# Patient Record
Sex: Female | Born: 1985 | Race: Black or African American | Hispanic: No | Marital: Single | Smoking: Never smoker
Health system: Southern US, Community
[De-identification: ages and names within clinical notes are randomized; demographics above are authoritative.]

## PROBLEM LIST (undated history)

## (undated) DIAGNOSIS — O24419 Gestational diabetes mellitus in pregnancy, unspecified control: Secondary | ICD-10-CM

## (undated) DIAGNOSIS — R519 Headache, unspecified: Secondary | ICD-10-CM

## (undated) DIAGNOSIS — Z8759 Personal history of other complications of pregnancy, childbirth and the puerperium: Secondary | ICD-10-CM

## (undated) DIAGNOSIS — F419 Anxiety disorder, unspecified: Secondary | ICD-10-CM

## (undated) DIAGNOSIS — R51 Headache: Secondary | ICD-10-CM

## (undated) DIAGNOSIS — F10129 Alcohol abuse with intoxication, unspecified: Secondary | ICD-10-CM

## (undated) HISTORY — DX: Personal history of other complications of pregnancy, childbirth and the puerperium: Z87.59

## (undated) HISTORY — DX: Alcohol abuse with intoxication, unspecified: F10.129

---
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
## 2005-01-25 HISTORY — PX: FOOT SURGERY: SHX648

## 2005-07-14 ENCOUNTER — Other Ambulatory Visit: Admission: RE | Admit: 2005-07-14 | Discharge: 2005-07-14 | Payer: Self-pay | Admitting: Family Medicine

## 2007-05-27 ENCOUNTER — Emergency Department (HOSPITAL_COMMUNITY): Admission: EM | Admit: 2007-05-27 | Discharge: 2007-05-27 | Payer: Self-pay | Admitting: Emergency Medicine

## 2007-06-22 ENCOUNTER — Other Ambulatory Visit: Admission: RE | Admit: 2007-06-22 | Discharge: 2007-06-22 | Payer: Self-pay | Admitting: Obstetrics and Gynecology

## 2007-06-29 ENCOUNTER — Emergency Department (HOSPITAL_COMMUNITY): Admission: EM | Admit: 2007-06-29 | Discharge: 2007-06-29 | Payer: Self-pay | Admitting: Emergency Medicine

## 2007-10-27 ENCOUNTER — Inpatient Hospital Stay (HOSPITAL_COMMUNITY): Admission: AD | Admit: 2007-10-27 | Discharge: 2007-10-27 | Payer: Self-pay | Admitting: Obstetrics and Gynecology

## 2007-11-28 ENCOUNTER — Inpatient Hospital Stay (HOSPITAL_COMMUNITY): Admission: AD | Admit: 2007-11-28 | Discharge: 2007-11-28 | Payer: Self-pay | Admitting: Obstetrics and Gynecology

## 2007-12-18 ENCOUNTER — Inpatient Hospital Stay (HOSPITAL_COMMUNITY): Admission: AD | Admit: 2007-12-18 | Discharge: 2007-12-18 | Payer: Self-pay | Admitting: Obstetrics and Gynecology

## 2008-01-01 ENCOUNTER — Inpatient Hospital Stay (HOSPITAL_COMMUNITY): Admission: AD | Admit: 2008-01-01 | Discharge: 2008-01-03 | Payer: Self-pay | Admitting: Obstetrics and Gynecology

## 2008-09-27 ENCOUNTER — Emergency Department (HOSPITAL_COMMUNITY): Admission: EM | Admit: 2008-09-27 | Discharge: 2008-09-27 | Payer: Self-pay | Admitting: Emergency Medicine

## 2010-01-13 ENCOUNTER — Other Ambulatory Visit
Admission: RE | Admit: 2010-01-13 | Discharge: 2010-01-13 | Payer: Self-pay | Source: Home / Self Care | Admitting: Family Medicine

## 2010-01-25 NOTE — L&D Delivery Note (Signed)
Operative Delivery Note At 6:25 PM a viable female was delivered via Vaginal, Spontaneous Delivery.  Presentation: vertex; Position: Direct Anterior ;   Delivery of the head: 09/30/2010  6:24 PM First maneuver: 09/30/2010  6:24 PM, Suprapubic Pressure McRoberts Second maneuver: Attempted delivery of posterior shoulder but difficult to mobilize. Attempted to delivery anterior shoulder with suprapubic pressure and shoulder delivered slowly. 1 min, 25 sec on perineum per RN.  Verbal consent: unable to obtain verbal consent due to emergency.  APGAR: 8, 9; weight 9 lb 5.4 oz (4235 g).   Placenta status: Intact, Spontaneous.   Cord: 3 vessels with the following complications: None.  Cord pH: 7.31  Nuchal cord x 1 easily reduced.  Anesthesia: Epidural  Episiotomy: None Lacerations: 2nd degree;Perineal Suture Repair: 2.0 3.0 chromic Est. Blood Loss (mL): 300 3 cc of 1% Lidocaine used  Mom to postpartum.  Baby to nursery-stable.  Sue Cooper 09/30/2010, 7:10 PM

## 2010-03-05 ENCOUNTER — Emergency Department (HOSPITAL_COMMUNITY)
Admission: EM | Admit: 2010-03-05 | Discharge: 2010-03-05 | Disposition: A | Discharge status: Home / Self Care | Payer: 59 | Attending: Emergency Medicine | Admitting: Emergency Medicine

## 2010-03-05 DIAGNOSIS — O210 Mild hyperemesis gravidarum: Secondary | ICD-10-CM | POA: Insufficient documentation

## 2010-03-05 DIAGNOSIS — O99891 Other specified diseases and conditions complicating pregnancy: Secondary | ICD-10-CM | POA: Insufficient documentation

## 2010-03-05 DIAGNOSIS — J3489 Other specified disorders of nose and nasal sinuses: Secondary | ICD-10-CM | POA: Insufficient documentation

## 2010-03-05 DIAGNOSIS — R42 Dizziness and giddiness: Secondary | ICD-10-CM | POA: Insufficient documentation

## 2010-03-05 LAB — POCT I-STAT, CHEM 8
BUN: 9 mg/dL (ref 6–23)
Calcium, Ion: 1.2 mmol/L (ref 1.12–1.32)
Chloride: 100 mEq/L (ref 96–112)
Creatinine, Ser: 0.8 mg/dL (ref 0.4–1.2)
Glucose, Bld: 121 mg/dL — ABNORMAL HIGH (ref 70–99)
HCT: 42 % (ref 36.0–46.0)
Hemoglobin: 14.3 g/dL (ref 12.0–15.0)
Potassium: 4 mEq/L (ref 3.5–5.1)
Sodium: 137 mEq/L (ref 135–145)
TCO2: 27 mmol/L (ref 0–100)

## 2010-03-05 LAB — URINALYSIS, ROUTINE W REFLEX MICROSCOPIC
Hgb urine dipstick: NEGATIVE
Ketones, ur: NEGATIVE mg/dL
Protein, ur: NEGATIVE mg/dL
Urine Glucose, Fasting: NEGATIVE mg/dL
pH: 6.5 (ref 5.0–8.0)

## 2010-03-05 LAB — POCT PREGNANCY, URINE: Preg Test, Ur: POSITIVE

## 2010-03-16 LAB — ABO/RH: RH Type: POSITIVE

## 2010-03-16 LAB — TYPE AND SCREEN: Antibody Screen: NEGATIVE

## 2010-03-16 LAB — HIV ANTIBODY (ROUTINE TESTING W REFLEX): HIV: NONREACTIVE

## 2010-03-16 LAB — HEPATITIS B SURFACE ANTIGEN: Hepatitis B Surface Ag: NEGATIVE

## 2010-04-09 ENCOUNTER — Inpatient Hospital Stay (HOSPITAL_COMMUNITY)
Admission: AD | Admit: 2010-04-09 | Discharge: 2010-04-09 | Disposition: A | Discharge status: Home / Self Care | Payer: 59 | Source: Ambulatory Visit | Attending: Obstetrics and Gynecology | Admitting: Obstetrics and Gynecology

## 2010-04-09 ENCOUNTER — Inpatient Hospital Stay (HOSPITAL_COMMUNITY): Payer: 59

## 2010-04-09 DIAGNOSIS — R1032 Left lower quadrant pain: Secondary | ICD-10-CM | POA: Insufficient documentation

## 2010-04-09 DIAGNOSIS — O99891 Other specified diseases and conditions complicating pregnancy: Secondary | ICD-10-CM | POA: Insufficient documentation

## 2010-04-09 DIAGNOSIS — O9989 Other specified diseases and conditions complicating pregnancy, childbirth and the puerperium: Secondary | ICD-10-CM

## 2010-04-09 LAB — WET PREP, GENITAL
Clue Cells Wet Prep HPF POC: NONE SEEN
Trich, Wet Prep: NONE SEEN

## 2010-04-09 LAB — URINALYSIS, ROUTINE W REFLEX MICROSCOPIC
Glucose, UA: NEGATIVE mg/dL
Ketones, ur: NEGATIVE mg/dL
Nitrite: NEGATIVE
Specific Gravity, Urine: 1.025 (ref 1.005–1.030)
pH: 7 (ref 5.0–8.0)

## 2010-07-13 ENCOUNTER — Inpatient Hospital Stay (HOSPITAL_COMMUNITY)
Admission: AD | Admit: 2010-07-13 | Discharge: 2010-07-13 | Disposition: A | Discharge status: Home / Self Care | Payer: 59 | Source: Ambulatory Visit | Attending: Obstetrics and Gynecology | Admitting: Obstetrics and Gynecology

## 2010-07-13 DIAGNOSIS — O9989 Other specified diseases and conditions complicating pregnancy, childbirth and the puerperium: Secondary | ICD-10-CM

## 2010-07-13 DIAGNOSIS — R109 Unspecified abdominal pain: Secondary | ICD-10-CM

## 2010-07-13 DIAGNOSIS — O99891 Other specified diseases and conditions complicating pregnancy: Secondary | ICD-10-CM

## 2010-07-13 LAB — URINALYSIS, ROUTINE W REFLEX MICROSCOPIC
Bilirubin Urine: NEGATIVE
Hgb urine dipstick: NEGATIVE
Ketones, ur: 15 mg/dL — AB
Nitrite: NEGATIVE
Protein, ur: NEGATIVE mg/dL
Specific Gravity, Urine: 1.015 (ref 1.005–1.030)
Urobilinogen, UA: 0.2 mg/dL (ref 0.0–1.0)

## 2010-07-13 LAB — FETAL FIBRONECTIN: Fetal Fibronectin: NEGATIVE

## 2010-07-15 ENCOUNTER — Inpatient Hospital Stay (HOSPITAL_COMMUNITY)
Admission: AD | Admit: 2010-07-15 | Discharge: 2010-07-15 | Disposition: A | Discharge status: Home / Self Care | Payer: 59 | Source: Ambulatory Visit | Attending: Obstetrics and Gynecology | Admitting: Obstetrics and Gynecology

## 2010-07-15 DIAGNOSIS — Z79899 Other long term (current) drug therapy: Secondary | ICD-10-CM | POA: Insufficient documentation

## 2010-07-15 DIAGNOSIS — N764 Abscess of vulva: Secondary | ICD-10-CM | POA: Insufficient documentation

## 2010-07-15 DIAGNOSIS — O2390 Unspecified genitourinary tract infection in pregnancy, unspecified trimester: Secondary | ICD-10-CM

## 2010-08-17 ENCOUNTER — Encounter (HOSPITAL_COMMUNITY): Payer: Self-pay | Admitting: *Deleted

## 2010-08-17 ENCOUNTER — Inpatient Hospital Stay (HOSPITAL_COMMUNITY)
Admission: AD | Admit: 2010-08-17 | Discharge: 2010-08-19 | DRG: 781 | Disposition: A | Discharge status: Home / Self Care | Payer: 59 | Source: Ambulatory Visit | Attending: Obstetrics and Gynecology | Admitting: Obstetrics and Gynecology

## 2010-08-17 DIAGNOSIS — O99810 Abnormal glucose complicating pregnancy: Principal | ICD-10-CM | POA: Diagnosis present

## 2010-08-17 DIAGNOSIS — O24419 Gestational diabetes mellitus in pregnancy, unspecified control: Secondary | ICD-10-CM

## 2010-08-17 DIAGNOSIS — O368390 Maternal care for abnormalities of the fetal heart rate or rhythm, unspecified trimester, not applicable or unspecified: Secondary | ICD-10-CM | POA: Diagnosis present

## 2010-08-17 DIAGNOSIS — O4700 False labor before 37 completed weeks of gestation, unspecified trimester: Secondary | ICD-10-CM | POA: Diagnosis present

## 2010-08-17 LAB — URINALYSIS, ROUTINE W REFLEX MICROSCOPIC
Bilirubin Urine: NEGATIVE
Ketones, ur: >80 mg/dL — AB
Leukocytes, UA: NEGATIVE
Nitrite: NEGATIVE
Protein, ur: NEGATIVE mg/dL

## 2010-08-17 LAB — GLUCOSE, CAPILLARY

## 2010-08-17 LAB — CBC
MCH: 29 pg (ref 26.0–34.0)
MCHC: 33.2 g/dL (ref 30.0–36.0)
MCV: 87.4 fL (ref 78.0–100.0)
Platelets: 229 10*3/uL (ref 150–400)

## 2010-08-17 LAB — WET PREP, GENITAL

## 2010-08-17 MED ORDER — PRENATAL PLUS 27-1 MG PO TABS
1.0000 | ORAL_TABLET | Freq: Every day | ORAL | Status: DC
  Administered 2010-08-17 – 2010-08-19 (×3): 1 via ORAL
  Filled 2010-08-17 (×3): qty 1

## 2010-08-17 MED ORDER — INSULIN REGULAR HUMAN 100 UNIT/ML IJ SOLN
25.0000 [IU] | Freq: Every day | INTRAMUSCULAR | Status: DC
  Administered 2010-08-18 – 2010-08-19 (×2): 25 [IU] via SUBCUTANEOUS
  Filled 2010-08-17 (×4): qty 10

## 2010-08-17 MED ORDER — CALCIUM CARBONATE ANTACID 500 MG PO CHEW: 2.0000 | CHEWABLE_TABLET | ORAL | Status: DC | PRN

## 2010-08-17 MED ORDER — LACTATED RINGERS IV SOLN
INTRAVENOUS | Status: DC
  Administered 2010-08-17: 16:00:00 via INTRAVENOUS

## 2010-08-17 MED ORDER — INSULIN NPH (HUMAN) (ISOPHANE) 100 UNIT/ML ~~LOC~~ SUSP
50.0000 [IU] | Freq: Every day | SUBCUTANEOUS | Status: DC
  Administered 2010-08-18 – 2010-08-19 (×2): 50 [IU] via SUBCUTANEOUS
  Filled 2010-08-17: qty 3

## 2010-08-17 MED ORDER — ZOLPIDEM TARTRATE 10 MG PO TABS: 10.0000 mg | ORAL_TABLET | Freq: Every evening | ORAL | Status: DC | PRN

## 2010-08-17 MED ORDER — DOCUSATE SODIUM 100 MG PO CAPS
100.0000 mg | ORAL_CAPSULE | Freq: Every day | ORAL | Status: DC
  Administered 2010-08-17 – 2010-08-19 (×3): 100 mg via ORAL
  Filled 2010-08-17 (×3): qty 1

## 2010-08-17 MED ORDER — INSULIN REGULAR HUMAN 100 UNIT/ML IJ SOLN
19.0000 [IU] | Freq: Every day | INTRAMUSCULAR | Status: DC
  Administered 2010-08-17 – 2010-08-18 (×2): 19 [IU] via SUBCUTANEOUS
  Filled 2010-08-17 (×6): qty 10

## 2010-08-17 MED ORDER — INSULIN NPH (HUMAN) (ISOPHANE) 100 UNIT/ML ~~LOC~~ SUSP
19.0000 [IU] | Freq: Every day | SUBCUTANEOUS | Status: AC
  Administered 2010-08-17 – 2010-08-18 (×2): 19 [IU] via SUBCUTANEOUS
  Filled 2010-08-17: qty 10

## 2010-08-17 MED ORDER — ACETAMINOPHEN 325 MG PO TABS: 650.0000 mg | ORAL_TABLET | ORAL | Status: DC | PRN

## 2010-08-17 MED ORDER — GUAIFENESIN 100 MG/5ML PO SOLN: 5.0000 mL | ORAL | Status: DC | PRN

## 2010-08-17 MED ORDER — LACTATED RINGERS IV SOLN
INTRAVENOUS | Status: AC
  Administered 2010-08-17: 18:00:00 via INTRAVENOUS

## 2010-08-18 ENCOUNTER — Inpatient Hospital Stay (HOSPITAL_COMMUNITY): Payer: 59

## 2010-08-18 ENCOUNTER — Encounter (HOSPITAL_COMMUNITY): Payer: Self-pay | Admitting: Dietician

## 2010-08-18 ENCOUNTER — Ambulatory Visit (HOSPITAL_COMMUNITY): Payer: 59

## 2010-08-18 LAB — GLUCOSE, CAPILLARY
Glucose-Capillary: 92 mg/dL (ref 70–99)
Glucose-Capillary: 165 mg/dL — ABNORMAL HIGH (ref 70–99)

## 2010-08-18 MED ORDER — LACTATED RINGERS IV SOLN
INTRAVENOUS | Status: DC
  Administered 2010-08-18 (×3): via INTRAVENOUS

## 2010-08-18 MED ORDER — BD GETTING STARTED TAKE HOME KIT: 1/2ML X 30G SYRINGES
1.0000 | Freq: Once | Status: AC
  Administered 2010-08-18: 1
  Filled 2010-08-18: qty 1

## 2010-08-18 MED ORDER — NIFEDIPINE 10 MG PO CAPS
10.0000 mg | ORAL_CAPSULE | Freq: Three times a day (TID) | ORAL | Status: DC
  Administered 2010-08-18 – 2010-08-19 (×3): 10 mg via ORAL
  Filled 2010-08-18 (×3): qty 1

## 2010-08-18 NOTE — Progress Notes (Signed)
UR Chart review completed.  

## 2010-08-18 NOTE — Progress Notes (Signed)
  Patient had u/s for bpp that was 8/8 this am .. Her cbg fasting this am is 92... Her 2 hr pp after breakfast was 119 // she reports feeling contractions 4-5 times an hour + fm no lof no vaginal bleeding   Af vss fhr baseline 140 good btbv + accels occasional variable decel toco ctx 4-5 times an hour Cervix closed on exam this am by nurse  Abdomen gravid nontender   A/P 32 wks 1 day with gdm... Continue insulin  Procardia for ctx Probable d/c home tomorrow

## 2010-08-18 NOTE — Plan of Care (Signed)
Problem: Consults Goal: Diabetes Guidelines per MD order/protocol Outcome: Progressing Teaching insulin administration: Regular and NPH

## 2010-08-18 NOTE — Progress Notes (Signed)
Nutrition education consult for Carbohydrate Modified Gestational Diabetic Diet completed.  "Meal  plan for gestational diabetics" handout given to patient.  Basic concepts reviewed.  Questions answered.  Patient verbalizes understanding. Patients Mother present, who is also diabetic. Patient demonstrated ability to plan out meals.    Nutrition Dx: Food and nutrition-related knowledge deficit r/t limited comprehension of previous education aeb pt report.

## 2010-08-18 NOTE — H&P (Signed)
Sue Cooper is a 25 y.o. female presenting at [redacted] wks ega based on 13 wk u/s with EDD 10/12/2010. She is admitted for management of uncontrolled diabetes and BPP of 6/8 in the office. She reports + fm no lof no ctx. No vaginal bleeding. She is currently on glyburide and states that her bs are 180 fasting and 250's pp.   PMH gestational dm PSH foot surgery Pob hx  12/2007 SVD at term 8 lbs 8 oz 2005 EAB  Medication glyburide pnv robitusson  Allergies NKDA Social denies tob/ etoh/ illicits   ROS cough and congestion    OB History    Grav Para Term Preterm Abortions TAB SAB Ect Mult Living   3 1 1  0 1 1    1      Past Medical History  Diagnosis Date  . Diabetes mellitus    Past Surgical History  Procedure Date  . Foot surgery 2007    pin right foot   Family History: family history includes Diabetes in her father and mother. Social History:  reports that she has never smoked. She has never used smokeless tobacco. She reports that she does not drink alcohol. Her drug history not on file.    Blood pressure 97/45, pulse 94, temperature 98 F (36.7 C), temperature source Oral, resp. rate 24, height 5\' 4"  (1.626 m), weight 109.77 kg (242 lb).  General: A&O NAD CV rrr Lungs clear abd gravid nontender Ext 1+edema B  Prenatal labs: ABO, Rh:  A positive  Antibody: Negative (02/20 0000) Rubella:   RPR: Nonreactive (02/20 0000)  HBsAg: Negative (02/20 0000)  HIV: Non-reactive (02/20 0000)  GBS:   unknown  Assessment/Plan: 32 wks with nonreassuring fetal testing BPP 6/8 .Marland Kitchen And uncontrolled diabetes.. Admit for observation to antenatal continue EFM and toco Start Insulin for diabetes.. Diabetic educator to see patient. Plan for repeat BPP in am in the MFM office for recommendations in the event that the bpp is not 8/8   Pistol Kessenich J. 08/18/2010, 8:00 AM

## 2010-08-18 NOTE — Progress Notes (Signed)
Patient would benefit from using the pregnant diabetes order set using NPH in the am and at HS with Novolog insulin tid with meals and 2 hr post-prandial correction.  However, pt prescribed NPH in the am and ac dinner with Regular covering meals. Pt to learn to administer insulin and mix NPH with Regular.

## 2010-08-18 NOTE — Progress Notes (Signed)
Patient seen for BPP in [facility].  BPP 8/8.  Result given to Dr. Richardson Dopp.  Per Dr. Richardson Dopp no consult is required given reassuring fetal status and improved glycemic control since starting insulin.

## 2010-08-18 NOTE — Plan of Care (Signed)
Problem: Consults Goal: Diabetes Guidelines per MD order/protocol Outcome: Progressing Patient will need to learn how to check glucose using hospital meter.

## 2010-08-19 LAB — GLUCOSE, CAPILLARY
Glucose-Capillary: 102 mg/dL — ABNORMAL HIGH (ref 70–99)
Glucose-Capillary: 130 mg/dL — ABNORMAL HIGH (ref 70–99)

## 2010-08-19 MED ORDER — NIFEDIPINE 10 MG PO CAPS: 10.0000 mg | ORAL_CAPSULE | Freq: Three times a day (TID) | ORAL | Status: DC

## 2010-08-19 MED ORDER — INSULIN REGULAR HUMAN 100 UNIT/ML IJ SOLN: 19.0000 [IU] | Freq: Every day | INTRAMUSCULAR | Status: DC

## 2010-08-19 MED ORDER — INSULIN NPH (HUMAN) (ISOPHANE) 100 UNIT/ML ~~LOC~~ SUSP: 50.0000 [IU] | Freq: Every day | SUBCUTANEOUS | Status: DC

## 2010-08-19 MED ORDER — INSULIN REGULAR HUMAN 100 UNIT/ML IJ SOLN: 25.0000 [IU] | Freq: Every day | INTRAMUSCULAR | Status: DC

## 2010-08-19 MED ORDER — INSULIN NPH (HUMAN) (ISOPHANE) 100 UNIT/ML ~~LOC~~ SUSP: 19.0000 [IU] | Freq: Every day | SUBCUTANEOUS | Status: DC

## 2010-08-19 MED ORDER — INSULIN SYRINGES (DISPOSABLE) U-100 1 ML MISC: Status: DC

## 2010-08-19 NOTE — Discharge Summary (Signed)
Physician Discharge Summary  Patient ID: ELESHIA WOOLEY MRN: [id number] DOB/AGE: May 21, 1985 25 y.o.  Admit date: 08/17/2010 Discharge date: 08/19/2010  Admission Diagnoses:32 wks IUP ... 2. Uncontrolled gestational diabetes.... 3 nonreassuring fetal testing   Discharge Diagnoses: 32 wks 2 days ... 2 gestational A2 DM.... 3 preterm contractions  Active Problems:  * No active hospital problems. *    Discharged Condition: good  Hospital Course:  Pt was admitted from the office on 08/17/2010 with uncontrolled gestational diabetes and bpp of 6 out of 8... She was admitted started on insulin for diabetes and she had a repeat BPP on 08/18/2010 that was 8 out of 8. She developed preterm contractions 4-5 an hour her cervix was closed.. She received procardia 10 mg with resolution of preterm contractions   Consults:  Diabetic educator consult   Significant Diagnostic Studies: BPP 8 out of 8  Treatments: IV hydration, insulin: regular and NPH and procardia 10 mg   Discharge Exam: Blood pressure 104/74, pulse 93, temperature 97.8 F (36.6 C), temperature source Oral, resp. rate 18, height 5\' 4"  (1.626 m), weight 109.77 kg (242 lb). General appearance: alert, cooperative and no distress  Abdomen gravid nontender   Disposition: Home or Self Care  Discharge Orders    Future Orders Please Complete By Expires   Diet Carb Modified      Increase activity slowly      HIV antibody      Comments:   This external order was created through the Results Console.   Hepatitis B surface antigen      Comments:   This external order was created through the Results Console.   RPR      Comments:   This external order was created through the Results Console.   Type and screen      Comments:   This external order was created through the Results Console.   ABO/Rh      Comments:   This external order was created through the Results Console.     Current Discharge Medication List    START taking these  medications   Details  !! insulin NPH (HUMULIN N) 100 UNIT/ML injection Inject 19 Units into the skin at bedtime. Qty: 10 mL, Refills: 3    !! insulin NPH (HUMULIN N,NOVOLIN N) 100 UNIT/ML injection Inject 50 Units into the skin daily before breakfast. Qty: 10 mL, Refills: 2    !! insulin regular (HUMULIN R,NOVOLIN R) 100 units/mL injection Inject 0.19 mLs (19 Units total) into the skin daily. Qty: 10 mL, Refills: 3    !! insulin regular (HUMULIN R,NOVOLIN R) 100 units/mL injection Inject 0.25 mLs (25 Units total) into the skin daily with breakfast. Qty: 10 mL, Refills: 3    Insulin Syringes, Disposable, U-100 1 ML MISC Use for insulin administration Qty: 100 each, Refills: 2    NIFEdipine (PROCARDIA) 10 MG capsule Take 1 capsule (10 mg total) by mouth every 8 (eight) hours. Qty: 60 capsule, Refills: 0     !! - Potential duplicate medications found. Please discuss with provider.    CONTINUE these medications which have NOT CHANGED   Details  guaifenesin (ROBITUSSIN) 100 MG/5ML syrup Take 100 mg by mouth 3 (three) times daily as needed.      prenatal vitamin w/FE, FA (PRENATAL 1 + 1) 27-1 MG TABS Take 1 tablet by mouth daily.        STOP taking these medications     glyBURIDE (DIABETA) 5 MG tablet  Follow-up Information    Follow up with Annahi Short J..   Contact information:   [address] [place] [address] [phone]        follow up with Dr. Richardson Dopp on 08/24/2010   Signed: Jessee Avers. 08/19/2010, 9:25 AM

## 2010-08-19 NOTE — Progress Notes (Signed)
Pt states that contractions are less frequent.. They are 1 an hour or so. She reports Positive fetal movement. No vaginal bleeding. No lof AF vss CBg fasting this am is 102  Abd gravid nontender Ext 1 + edema bilaterally  A/p 32 wks 2 days gestational diabetes / preterm contractions/  Improved D/c home on insulin and procardia  Follow up in the office on 08/24/2010

## 2010-08-24 ENCOUNTER — Ambulatory Visit (HOSPITAL_COMMUNITY)
Admission: RE | Admit: 2010-08-24 | Discharge: 2010-08-24 | Disposition: A | Discharge status: Home / Self Care | Payer: 59 | Source: Ambulatory Visit | Attending: Obstetrics and Gynecology | Admitting: Obstetrics and Gynecology

## 2010-08-24 ENCOUNTER — Inpatient Hospital Stay (HOSPITAL_COMMUNITY)
Admission: AD | Admit: 2010-08-24 | Discharge: 2010-08-24 | Disposition: A | Discharge status: Home / Self Care | Payer: 59 | Source: Ambulatory Visit | Attending: Obstetrics and Gynecology | Admitting: Obstetrics and Gynecology

## 2010-08-24 ENCOUNTER — Other Ambulatory Visit (HOSPITAL_COMMUNITY): Payer: Self-pay | Admitting: Obstetrics and Gynecology

## 2010-08-24 DIAGNOSIS — O09219 Supervision of pregnancy with history of pre-term labor, unspecified trimester: Secondary | ICD-10-CM | POA: Insufficient documentation

## 2010-08-24 DIAGNOSIS — E669 Obesity, unspecified: Secondary | ICD-10-CM | POA: Insufficient documentation

## 2010-08-24 DIAGNOSIS — O99210 Obesity complicating pregnancy, unspecified trimester: Secondary | ICD-10-CM | POA: Insufficient documentation

## 2010-08-24 DIAGNOSIS — O24919 Unspecified diabetes mellitus in pregnancy, unspecified trimester: Secondary | ICD-10-CM | POA: Insufficient documentation

## 2010-08-24 DIAGNOSIS — O288 Other abnormal findings on antenatal screening of mother: Secondary | ICD-10-CM

## 2010-08-24 NOTE — Progress Notes (Signed)
In office, was hooked up to NST.  Couldn't get [place] down. Hx gest diab on insulin.  Denies bp problems.

## 2010-08-29 ENCOUNTER — Encounter (HOSPITAL_COMMUNITY): Payer: Self-pay | Admitting: *Deleted

## 2010-08-29 ENCOUNTER — Inpatient Hospital Stay (HOSPITAL_COMMUNITY)
Admission: AD | Admit: 2010-08-29 | Discharge: 2010-08-29 | Disposition: A | Discharge status: Home / Self Care | Payer: 59 | Source: Ambulatory Visit | Attending: Obstetrics and Gynecology | Admitting: Obstetrics and Gynecology

## 2010-08-29 DIAGNOSIS — O99810 Abnormal glucose complicating pregnancy: Secondary | ICD-10-CM | POA: Insufficient documentation

## 2010-08-29 DIAGNOSIS — O0990 Supervision of high risk pregnancy, unspecified, unspecified trimester: Secondary | ICD-10-CM

## 2010-08-29 DIAGNOSIS — O4700 False labor before 37 completed weeks of gestation, unspecified trimester: Secondary | ICD-10-CM | POA: Diagnosis present

## 2010-08-29 DIAGNOSIS — O24419 Gestational diabetes mellitus in pregnancy, unspecified control: Secondary | ICD-10-CM

## 2010-08-29 DIAGNOSIS — O09299 Supervision of pregnancy with other poor reproductive or obstetric history, unspecified trimester: Secondary | ICD-10-CM

## 2010-08-29 HISTORY — DX: Gestational diabetes mellitus in pregnancy, unspecified control: O24.419

## 2010-08-29 LAB — URINALYSIS, ROUTINE W REFLEX MICROSCOPIC
Bilirubin Urine: NEGATIVE
Ketones, ur: 15 mg/dL — AB
Leukocytes, UA: NEGATIVE
Nitrite: NEGATIVE
Protein, ur: NEGATIVE mg/dL
Urobilinogen, UA: 0.2 mg/dL (ref 0.0–1.0)
pH: 6.5 (ref 5.0–8.0)

## 2010-08-29 LAB — WET PREP, GENITAL

## 2010-08-29 NOTE — ED Provider Notes (Signed)
History     Chief Complaint  Patient presents with  . Contractions  . Leg Swelling   HPI  Sue Cooper  is a 25 y.o. G3P1011 at [redacted]w[redacted]d weeks presenting with contractions. Reports UCs q 15 minutes at home, now farther apart and not as strong. No bleeding or LOF. + fetal movement. Also c/o bilateral pedal edema - on her feet a lot at work. Pregnancy complicated by insulin dependent GDM. Drank a Dr. Reino Kent on the way here.   OB History    Grav Para Term Preterm Abortions TAB SAB Ect Mult Living   3 1 1  0 1 1    1       Past Medical History  Diagnosis Date  . Diabetes mellitus   . Gestational diabetes 08/29/2010    Past Surgical History  Procedure Date  . Foot surgery 2007    pin right foot    Family History  Problem Relation Age of Onset  . Diabetes Mother   . Diabetes Father     History  Substance Use Topics  . Smoking status: Never Smoker   . Smokeless tobacco: Never Used  . Alcohol Use: No    Allergies: No Known Allergies  Prescriptions prior to admission  Medication Sig Dispense Refill  . insulin NPH (HUMULIN N) 100 UNIT/ML injection Inject 19 Units into the skin at bedtime.  10 mL  3  . insulin NPH (HUMULIN N,NOVOLIN N) 100 UNIT/ML injection Inject 50 Units into the skin daily before breakfast.  10 mL  2  . insulin regular (HUMULIN R,NOVOLIN R) 100 units/mL injection Inject 0.19 mLs (19 Units total) into the skin daily.  10 mL  3  . insulin regular (HUMULIN R,NOVOLIN R) 100 units/mL injection Inject 0.25 mLs (25 Units total) into the skin daily with breakfast.  10 mL  3  . NIFEdipine (PROCARDIA) 10 MG capsule Take 1 capsule (10 mg total) by mouth every 8 (eight) hours.  60 capsule  0  . prenatal vitamin w/FE, FA (PRENATAL 1 + 1) 27-1 MG TABS Take 1 tablet by mouth daily.          Review of Systems  Constitutional: Negative.   Respiratory: Negative.   Cardiovascular: Negative.   Gastrointestinal: Negative for nausea, vomiting, abdominal pain, diarrhea and  constipation.  Genitourinary: Negative for dysuria, urgency, frequency, hematuria and flank pain.       Negative for vaginal bleeding, + contractions   Musculoskeletal: Negative.   Neurological: Negative.   Psychiatric/Behavioral: Negative.    Physical Exam   Blood pressure 110/62, pulse 78, temperature 98.7 F (37.1 C), temperature source Oral, resp. rate 18, height 5' 3.25" (1.607 m), weight 113.569 kg (250 lb 6 oz).  Physical Exam  Nursing note and vitals reviewed. Constitutional: She is oriented to person, place, and time. She appears well-developed and well-nourished. No distress.  HENT:  Head: Normocephalic and atraumatic.  Cardiovascular: Normal rate.   Respiratory: Effort normal.  GI: Soft. Bowel sounds are normal. She exhibits no mass. There is no tenderness. There is no rebound and no guarding.  Genitourinary: There is no rash or lesion on the right labia. There is no rash or lesion on the left labia. Uterus is not tender. Enlarged: Size c/w dates. Cervix exhibits no motion tenderness, no discharge and no friability. Right adnexum displays no mass, no tenderness and no fullness. Left adnexum displays no mass, no tenderness and no fullness. No tenderness or bleeding around the vagina. Vaginal discharge (creamy, curdy  white) found.       1/thick/high/posterior  Musculoskeletal: Normal range of motion.  Neurological: She is alert and oriented to person, place, and time.  Skin: Skin is warm and dry.  Psychiatric: She has a normal mood and affect.   EFM: Baseline:130s Variability:mod Accels:present Decels:absent  Toco:q14-30 minutes   MAU Course  Procedures  Results for orders placed during the hospital encounter of 08/29/10 (from the past 24 hour(s))  URINALYSIS, ROUTINE W REFLEX MICROSCOPIC     Status: Abnormal   Collection Time   08/29/10  2:00 AM      Component Value Range   Color, Urine YELLOW  YELLOW    Appearance CLEAR  CLEAR    Specific Gravity, Urine 1.010   1.005 - 1.030    pH 6.5  5.0 - 8.0    Glucose, UA 500 (*) NEGATIVE (mg/dL)   Hgb urine dipstick NEGATIVE  NEGATIVE    Bilirubin Urine NEGATIVE  NEGATIVE    Ketones, ur 15 (*) NEGATIVE (mg/dL)   Protein, ur NEGATIVE  NEGATIVE (mg/dL)   Urobilinogen, UA 0.2  0.0 - 1.0 (mg/dL)   Nitrite NEGATIVE  NEGATIVE    Leukocytes, UA NEGATIVE  NEGATIVE   GLUCOSE, CAPILLARY     Status: Abnormal   Collection Time   08/29/10  2:44 AM      Component Value Range   Glucose-Capillary 161 (*) 70 - 99 (mg/dL)   Comment 1 Notify RN    WET PREP, GENITAL     Status: Abnormal   Collection Time   08/29/10  3:24 AM      Component Value Range   Yeast, Wet Prep NONE SEEN  NONE SEEN    Trich, Wet Prep NONE SEEN  NONE SEEN    Clue Cells, Wet Prep NONE SEEN  NONE SEEN    WBC, Wet Prep HPF POC FEW (*) NONE SEEN     Rev'd with Dr. Dion Body, ok to d/c home if not in labor  Assessment and Plan  25 y.o. N5A2130 [redacted]w[redacted]d Threatened preterm labor F/U as scheduled or sooner PRN  Wellington Winegarden 08/29/2010, 4:06 AM

## 2010-08-29 NOTE — Progress Notes (Signed)
DENIES MRSA AND HSV 

## 2010-08-29 NOTE — Initial Assessments (Signed)
SAYS FEELS BETTER- REQUESTED NOTE  FOR WORK

## 2010-08-29 NOTE — Progress Notes (Signed)
DR Dion Body CALLED IN - CHECKING ON PT AT 0300

## 2010-08-29 NOTE — Progress Notes (Signed)
FRAZIER, CNM AT BEDSIDE- SPEC EXAM-

## 2010-08-29 NOTE — Progress Notes (Signed)
Pt states, " I started having contractions at 9:00 pm, and I drank a half glass of water and layed on my side. They are not as close as they were now. I have had swelling in my feet and legs for 2 wks. I went to the office on Monday and everything was but now my feet are hurting."

## 2010-09-10 ENCOUNTER — Other Ambulatory Visit (HOSPITAL_COMMUNITY): Payer: Self-pay | Admitting: Obstetrics and Gynecology

## 2010-09-10 ENCOUNTER — Ambulatory Visit (HOSPITAL_COMMUNITY)
Admission: RE | Admit: 2010-09-10 | Discharge: 2010-09-10 | Disposition: A | Discharge status: Home / Self Care | Payer: 59 | Source: Ambulatory Visit | Attending: Obstetrics and Gynecology | Admitting: Obstetrics and Gynecology

## 2010-09-10 DIAGNOSIS — O24919 Unspecified diabetes mellitus in pregnancy, unspecified trimester: Secondary | ICD-10-CM | POA: Insufficient documentation

## 2010-09-10 DIAGNOSIS — E669 Obesity, unspecified: Secondary | ICD-10-CM | POA: Insufficient documentation

## 2010-09-10 DIAGNOSIS — O99210 Obesity complicating pregnancy, unspecified trimester: Secondary | ICD-10-CM | POA: Insufficient documentation

## 2010-09-10 DIAGNOSIS — O288 Other abnormal findings on antenatal screening of mother: Secondary | ICD-10-CM

## 2010-09-10 DIAGNOSIS — O24419 Gestational diabetes mellitus in pregnancy, unspecified control: Secondary | ICD-10-CM

## 2010-09-10 DIAGNOSIS — O09219 Supervision of pregnancy with history of pre-term labor, unspecified trimester: Secondary | ICD-10-CM | POA: Insufficient documentation

## 2010-09-10 LAB — STREP B DNA PROBE: GBS: POSITIVE

## 2010-09-18 ENCOUNTER — Inpatient Hospital Stay (HOSPITAL_COMMUNITY)
Admission: AD | Admit: 2010-09-18 | Discharge: 2010-09-19 | Disposition: A | Discharge status: Home / Self Care | Payer: 59 | Source: Ambulatory Visit | Attending: Obstetrics and Gynecology | Admitting: Obstetrics and Gynecology

## 2010-09-18 DIAGNOSIS — O479 False labor, unspecified: Secondary | ICD-10-CM | POA: Insufficient documentation

## 2010-09-18 NOTE — Progress Notes (Signed)
Pt states. " I was laying down at 9 pm, and felt a gush. I got up and walked to the bathroom; I put two fingers down  There and my vagina felt wet, but it didn't go onto my pjs."

## 2010-09-19 ENCOUNTER — Encounter (HOSPITAL_COMMUNITY): Payer: Self-pay | Admitting: *Deleted

## 2010-09-19 LAB — POCT FERN TEST: Fern Test: NEGATIVE

## 2010-09-19 NOTE — ED Notes (Signed)
Dr Neva Seat notified of pt's admission and status. Aware of G3P1 at 36.6wks, fern neg, cervical exam, gest diab. On insulin. FHR reactive. Edema up to knees with normal B/P and same edema  with last pregnancy. Pt may go home

## 2010-09-19 NOTE — Progress Notes (Signed)
G3P1 at 36.6wks. Panties felt wet and small spot on bed wet tonight x 1 at 2230. No further leaking. Some braxton hicks ctxs. Gestational diabetes on insulin.

## 2010-09-19 NOTE — ED Notes (Signed)
Written and verbal d/c instructions given and understanding voiced. Pt d/c home with her mother.

## 2010-09-19 NOTE — Progress Notes (Signed)
Georges Mouse CNM in to see pt. Spec exam done to r/o SROM. Pt tol well

## 2010-09-24 ENCOUNTER — Encounter (HOSPITAL_COMMUNITY): Payer: Self-pay | Admitting: *Deleted

## 2010-09-24 ENCOUNTER — Telehealth (HOSPITAL_COMMUNITY): Payer: Self-pay | Admitting: *Deleted

## 2010-09-24 NOTE — Telephone Encounter (Signed)
Preadmission screen  

## 2010-09-30 ENCOUNTER — Inpatient Hospital Stay (HOSPITAL_COMMUNITY): Payer: 59 | Admitting: Anesthesiology

## 2010-09-30 ENCOUNTER — Encounter (HOSPITAL_COMMUNITY): Payer: Self-pay | Admitting: Anesthesiology

## 2010-09-30 ENCOUNTER — Inpatient Hospital Stay (HOSPITAL_COMMUNITY)
Admission: RE | Admit: 2010-09-30 | Discharge: 2010-10-02 | DRG: 775 | Disposition: A | Discharge status: Home / Self Care | Payer: 59 | Source: Ambulatory Visit | Attending: Obstetrics and Gynecology | Admitting: Obstetrics and Gynecology

## 2010-09-30 ENCOUNTER — Encounter (HOSPITAL_COMMUNITY): Payer: Self-pay

## 2010-09-30 ENCOUNTER — Other Ambulatory Visit: Payer: Self-pay | Admitting: Obstetrics and Gynecology

## 2010-09-30 DIAGNOSIS — O9903 Anemia complicating the puerperium: Secondary | ICD-10-CM | POA: Diagnosis not present

## 2010-09-30 DIAGNOSIS — D649 Anemia, unspecified: Secondary | ICD-10-CM | POA: Diagnosis not present

## 2010-09-30 DIAGNOSIS — O99814 Abnormal glucose complicating childbirth: Principal | ICD-10-CM | POA: Diagnosis present

## 2010-09-30 LAB — CBC
HCT: 30.7 % — ABNORMAL LOW (ref 36.0–46.0)
MCH: 28.7 pg (ref 26.0–34.0)
MCV: 86.5 fL (ref 78.0–100.0)
Platelets: 263 10*3/uL (ref 150–400)
RBC: 3.55 MIL/uL — ABNORMAL LOW (ref 3.87–5.11)
RDW: 13.7 % (ref 11.5–15.5)

## 2010-09-30 LAB — GLUCOSE, CAPILLARY
Glucose-Capillary: 76 mg/dL (ref 70–99)
Glucose-Capillary: 79 mg/dL (ref 70–99)
Glucose-Capillary: 101 mg/dL — ABNORMAL HIGH (ref 70–99)
Glucose-Capillary: 87 mg/dL (ref 70–99)
Glucose-Capillary: 83 mg/dL (ref 70–99)

## 2010-09-30 MED ORDER — SODIUM CHLORIDE 0.9 % IV SOLN: INTRAVENOUS | Status: DC

## 2010-09-30 MED ORDER — LIDOCAINE HCL (PF) 1 % IJ SOLN: 30.0000 mL | INTRAMUSCULAR | Status: DC | PRN

## 2010-09-30 MED ORDER — EPHEDRINE 5 MG/ML INJ
10.0000 mg | INTRAVENOUS | Status: DC | PRN
  Filled 2010-09-30 (×2): qty 4

## 2010-09-30 MED ORDER — DEXTROSE 50 % IV SOLN: 25.0000 mL | INTRAVENOUS | Status: DC | PRN

## 2010-09-30 MED ORDER — ACETAMINOPHEN 325 MG PO TABS: 650.0000 mg | ORAL_TABLET | ORAL | Status: DC | PRN

## 2010-09-30 MED ORDER — FENTANYL 2.5 MCG/ML BUPIVACAINE 1/10 % EPIDURAL INFUSION (WH - ANES)
14.0000 mL/h | INTRAMUSCULAR | Status: DC
  Administered 2010-09-30: 14 mL/h via EPIDURAL
  Filled 2010-09-30: qty 60

## 2010-09-30 MED ORDER — MISOPROSTOL 25 MCG QUARTER TABLET
25.0000 ug | ORAL_TABLET | ORAL | Status: DC | PRN
  Administered 2010-09-30: 25 ug via VAGINAL
  Filled 2010-09-30: qty 0.25
  Filled 2010-09-30: qty 1

## 2010-09-30 MED ORDER — OXYTOCIN 20 UNITS IN LACTATED RINGERS INFUSION - SIMPLE: 125.0000 mL/h | Freq: Once | INTRAVENOUS | Status: DC

## 2010-09-30 MED ORDER — ONDANSETRON HCL 4 MG PO TABS: 4.0000 mg | ORAL_TABLET | ORAL | Status: DC | PRN

## 2010-09-30 MED ORDER — LANOLIN HYDROUS EX OINT: TOPICAL_OINTMENT | CUTANEOUS | Status: DC | PRN

## 2010-09-30 MED ORDER — METHYLERGONOVINE MALEATE 0.2 MG/ML IJ SOLN: 0.2000 mg | INTRAMUSCULAR | Status: DC | PRN

## 2010-09-30 MED ORDER — DIBUCAINE 1 % RE OINT: 1.0000 "application " | TOPICAL_OINTMENT | RECTAL | Status: DC | PRN

## 2010-09-30 MED ORDER — PENICILLIN G POTASSIUM 5000000 UNITS IJ SOLR
5.0000 10*6.[IU] | Freq: Once | INTRAVENOUS | Status: DC
  Filled 2010-09-30: qty 5

## 2010-09-30 MED ORDER — DIPHENHYDRAMINE HCL 25 MG PO CAPS: 25.0000 mg | ORAL_CAPSULE | Freq: Four times a day (QID) | ORAL | Status: DC | PRN

## 2010-09-30 MED ORDER — PENICILLIN G POTASSIUM 5000000 UNITS IJ SOLR
2.5000 10*6.[IU] | INTRAVENOUS | Status: DC
  Filled 2010-09-30 (×3): qty 2.5

## 2010-09-30 MED ORDER — OXYCODONE-ACETAMINOPHEN 5-325 MG PO TABS
1.0000 | ORAL_TABLET | ORAL | Status: DC | PRN
Start: 2010-09-30 — End: 2010-10-02
  Administered 2010-09-30 – 2010-10-01 (×3): 1 via ORAL
  Administered 2010-10-01: 2 via ORAL
  Filled 2010-09-30 (×4): qty 1
  Filled 2010-09-30: qty 2

## 2010-09-30 MED ORDER — ONDANSETRON HCL 4 MG/2ML IJ SOLN: 4.0000 mg | Freq: Four times a day (QID) | INTRAMUSCULAR | Status: DC | PRN

## 2010-09-30 MED ORDER — PHENYLEPHRINE 40 MCG/ML (10ML) SYRINGE FOR IV PUSH (FOR BLOOD PRESSURE SUPPORT)
80.0000 ug | PREFILLED_SYRINGE | INTRAVENOUS | Status: DC | PRN
  Filled 2010-09-30 (×2): qty 5

## 2010-09-30 MED ORDER — ONDANSETRON HCL 4 MG/2ML IJ SOLN: 4.0000 mg | INTRAMUSCULAR | Status: DC | PRN

## 2010-09-30 MED ORDER — OXYTOCIN BOLUS FROM INFUSION
500.0000 mL | Freq: Once | INTRAVENOUS | Status: DC
  Filled 2010-09-30: qty 500

## 2010-09-30 MED ORDER — BENZOCAINE-MENTHOL 20-0.5 % EX AERO: 1.0000 "application " | INHALATION_SPRAY | CUTANEOUS | Status: DC | PRN

## 2010-09-30 MED ORDER — PRENATAL PLUS 27-1 MG PO TABS
1.0000 | ORAL_TABLET | Freq: Every day | ORAL | Status: DC
  Administered 2010-10-01 – 2010-10-02 (×2): 1 via ORAL
  Filled 2010-09-30 (×2): qty 1

## 2010-09-30 MED ORDER — EPHEDRINE 5 MG/ML INJ
10.0000 mg | INTRAVENOUS | Status: DC | PRN
  Filled 2010-09-30: qty 4

## 2010-09-30 MED ORDER — SIMETHICONE 80 MG PO CHEW: 80.0000 mg | CHEWABLE_TABLET | ORAL | Status: DC | PRN

## 2010-09-30 MED ORDER — BUTORPHANOL TARTRATE 2 MG/ML IJ SOLN: 1.0000 mg | INTRAMUSCULAR | Status: DC | PRN

## 2010-09-30 MED ORDER — LACTATED RINGERS IV SOLN
INTRAVENOUS | Status: DC
  Administered 2010-09-30: 125 mL/h via INTRAVENOUS

## 2010-09-30 MED ORDER — OXYCODONE-ACETAMINOPHEN 5-325 MG PO TABS: 2.0000 | ORAL_TABLET | ORAL | Status: DC | PRN

## 2010-09-30 MED ORDER — FLEET ENEMA 7-19 GM/118ML RE ENEM: 1.0000 | ENEMA | RECTAL | Status: DC | PRN

## 2010-09-30 MED ORDER — DIPHENHYDRAMINE HCL 50 MG/ML IJ SOLN: 12.5000 mg | INTRAMUSCULAR | Status: DC | PRN

## 2010-09-30 MED ORDER — OXYTOCIN 20 UNITS IN LACTATED RINGERS INFUSION - SIMPLE: 125.0000 mL/h | INTRAVENOUS | Status: DC | PRN

## 2010-09-30 MED ORDER — PENICILLIN G POTASSIUM 5000000 UNITS IJ SOLR
5.0000 10*6.[IU] | Freq: Once | INTRAVENOUS | Status: DC
  Administered 2010-09-30: 5 10*6.[IU] via INTRAVENOUS
  Filled 2010-09-30: qty 5

## 2010-09-30 MED ORDER — SODIUM CHLORIDE 0.9 % IV SOLN
INTRAVENOUS | Status: DC
  Administered 2010-09-30: 0.5 [IU]/h via INTRAVENOUS
  Filled 2010-09-30: qty 1

## 2010-09-30 MED ORDER — LACTATED RINGERS IV SOLN: 500.0000 mL | INTRAVENOUS | Status: DC | PRN

## 2010-09-30 MED ORDER — PHENYLEPHRINE 40 MCG/ML (10ML) SYRINGE FOR IV PUSH (FOR BLOOD PRESSURE SUPPORT)
80.0000 ug | PREFILLED_SYRINGE | INTRAVENOUS | Status: DC | PRN
  Filled 2010-09-30: qty 5

## 2010-09-30 MED ORDER — TERBUTALINE SULFATE 1 MG/ML IJ SOLN: 0.2500 mg | Freq: Once | INTRAMUSCULAR | Status: AC | PRN

## 2010-09-30 MED ORDER — IBUPROFEN 600 MG PO TABS: 600.0000 mg | ORAL_TABLET | Freq: Four times a day (QID) | ORAL | Status: DC | PRN

## 2010-09-30 MED ORDER — SENNOSIDES-DOCUSATE SODIUM 8.6-50 MG PO TABS
2.0000 | ORAL_TABLET | Freq: Every day | ORAL | Status: DC
  Administered 2010-09-30 – 2010-10-01 (×2): 2 via ORAL

## 2010-09-30 MED ORDER — CITRIC ACID-SODIUM CITRATE 334-500 MG/5ML PO SOLN: 30.0000 mL | ORAL | Status: DC | PRN

## 2010-09-30 MED ORDER — TETANUS-DIPHTH-ACELL PERTUSSIS 5-2.5-18.5 LF-MCG/0.5 IM SUSP
0.5000 mL | Freq: Once | INTRAMUSCULAR | Status: AC
  Administered 2010-10-01: 0.5 mL via INTRAMUSCULAR
  Filled 2010-09-30: qty 0.5

## 2010-09-30 MED ORDER — OXYTOCIN 20 UNITS IN LACTATED RINGERS INFUSION - SIMPLE
INTRAVENOUS | Status: AC
  Administered 2010-09-30: 20 [IU]
  Filled 2010-09-30: qty 1000

## 2010-09-30 MED ORDER — IBUPROFEN 600 MG PO TABS
600.0000 mg | ORAL_TABLET | Freq: Four times a day (QID) | ORAL | Status: DC
  Administered 2010-09-30 – 2010-10-02 (×8): 600 mg via ORAL
  Filled 2010-09-30 (×8): qty 1

## 2010-09-30 MED ORDER — MAGNESIUM HYDROXIDE 400 MG/5ML PO SUSP: 30.0000 mL | ORAL | Status: DC | PRN

## 2010-09-30 MED ORDER — WITCH HAZEL-GLYCERIN EX PADS: 1.0000 "application " | MEDICATED_PAD | CUTANEOUS | Status: DC | PRN

## 2010-09-30 MED ORDER — FERROUS SULFATE 325 (65 FE) MG PO TABS
325.0000 mg | ORAL_TABLET | Freq: Two times a day (BID) | ORAL | Status: DC
  Administered 2010-10-01 – 2010-10-02 (×3): 325 mg via ORAL
  Filled 2010-09-30 (×3): qty 1

## 2010-09-30 MED ORDER — LIDOCAINE HCL 1.5 % IJ SOLN
INTRAMUSCULAR | Status: DC | PRN
  Administered 2010-09-30: 2 mL via EPIDURAL
  Administered 2010-09-30 (×2): 5 mL via EPIDURAL

## 2010-09-30 MED ORDER — PENICILLIN G POTASSIUM 5000000 UNITS IJ SOLR
2.5000 10*6.[IU] | INTRAVENOUS | Status: DC
  Filled 2010-09-30 (×5): qty 2.5

## 2010-09-30 MED ORDER — LIDOCAINE HCL (PF) 1 % IJ SOLN
INTRAMUSCULAR | Status: AC
  Administered 2010-09-30: 30 mL
  Filled 2010-09-30: qty 30

## 2010-09-30 MED ORDER — METHYLERGONOVINE MALEATE 0.2 MG PO TABS: 0.2000 mg | ORAL_TABLET | ORAL | Status: DC | PRN

## 2010-09-30 MED ORDER — PENICILLIN G POTASSIUM 5000000 UNITS IJ SOLR
2.5000 10*6.[IU] | INTRAVENOUS | Status: DC
  Administered 2010-09-30: 2.5 10*6.[IU] via INTRAVENOUS
  Filled 2010-09-30 (×3): qty 2.5

## 2010-09-30 MED ORDER — LACTATED RINGERS IV SOLN
500.0000 mL | Freq: Once | INTRAVENOUS | Status: AC
  Administered 2010-09-30: 500 mL via INTRAVENOUS

## 2010-09-30 NOTE — Anesthesia Preprocedure Evaluation (Signed)
Anesthesia Evaluation  Name, MR# and DOB Patient awake  General Assessment Comment  Reviewed: Allergy & Precautions, H&P , NPO status , Patient's Chart, lab work & pertinent test results, reviewed documented beta blocker date and time   History of Anesthesia Complications Negative for: history of anesthetic complications  Airway Mallampati: II TM Distance: >3 FB Neck ROM: full    Dental  (+) Teeth Intact   Pulmonary  clear to auscultation  breath sounds clear to auscultation none    Cardiovascular regular Normal    Neuro/Psych Negative Neurological ROS  Negative Psych ROS  GI/Hepatic/Renal negative GI ROS  negative Liver ROS  negative Renal ROS        Endo/Other  (+) Diabetes mellitus-, Gestational, Insulin Dependent,  Morbid obesity  Abdominal   Musculoskeletal   Hematology negative hematology ROS (+)   Peds  Reproductive/Obstetrics (+) Pregnancy    Anesthesia Other Findings             Anesthesia Physical Anesthesia Plan  ASA: III  Anesthesia Plan: Epidural   Post-op Pain Management:    Induction:   Airway Management Planned:   Additional Equipment:   Intra-op Plan:   Post-operative Plan:   Informed Consent: I have reviewed the patients History and Physical, chart, labs and discussed the procedure including the risks, benefits and alternatives for the proposed anesthesia with the patient or authorized representative who has indicated his/her understanding and acceptance.     Plan Discussed with:   Anesthesia Plan Comments:         Anesthesia Quick Evaluation

## 2010-09-30 NOTE — Anesthesia Procedure Notes (Signed)
Epidural Patient location during procedure: OB Start time: 09/30/2010 2:15 PM Reason for block: procedure for pain  Staffing Performed by: anesthesiologist   Preanesthetic Checklist Completed: patient identified, site marked, surgical consent, pre-op evaluation, timeout performed, IV checked, risks and benefits discussed and monitors and equipment checked  Epidural Patient position: sitting Prep: site prepped and draped and DuraPrep Patient monitoring: continuous pulse ox and blood pressure Approach: midline Injection technique: LOR air  Needle:  Needle type: Tuohy  Needle gauge: 17 G Needle length: 9 cm Needle insertion depth: 6 cm Catheter type: closed end flexible Catheter size: 19 Gauge Catheter at skin depth: 11 cm Test dose: negative  Assessment Events: blood not aspirated, injection not painful, no injection resistance, negative IV test and no paresthesia

## 2010-09-30 NOTE — Progress Notes (Signed)
Sue Cooper is a 25 y.o. G3P1011 at [redacted]w[redacted]d admitted for induction of labor due to GDM on Insulin. Assumption of Care  Subjective: Called by RN.  Cervix 9.5 cm and pt feeling pressure.  Upon arrival to bedside pt comfortable  Objective: VS wnl  I/O this shift: In: 500 [I.V.:500] Out: 325 [Urine:325]  FHT:  FHR: 140s bpm, variability: moderate,  accelerations:  Present,  decelerations:  Absent UC:   regular, every 2-5 minutes SVE:   9.5, 100% 0 AROM clear  Labs: Lab Results  Component Value Date   WBC 10.5 09/30/2010   HGB 10.2* 09/30/2010   HCT 30.7* 09/30/2010   MCV 86.5 09/30/2010   PLT 263 09/30/2010    Assessment / Plan: Spontaneous labor, progressing normally  Labor: Progressing normally Preeclampsia:  no signs or symptoms of toxicity Fetal Wellbeing:  Reassuring  Pain Control:  Epidural I/D:  n/a Anticipated MOD:  NSVD  Shadiyah Wernli 09/30/2010, 6:54 PM   LATE ENTRY

## 2010-09-30 NOTE — H&P (Addendum)
Sue Cooper is a 25 y.o. female presenting for induction at 38 wks and 5 days. She has gestational diabetes that is controlled with insulin. She received cytotec this morning for ripening. @IPILAPH @ OB History    Grav Para Term Preterm Abortions TAB SAB Ect Mult Living   3 1 1  0 1 1    1     POB hx... [redacted] wks gestational diabetes.. Preterm labor at 28 wks... Vaginal delivery12/08/2007 EAB x 1  Past Medical History  Diagnosis Date  . Diabetes mellitus   . Gestational diabetes 08/29/2010   Past Surgical History  Procedure Date  . Foot surgery 2007    pin right foot   Family History: family history includes Diabetes in her father and mother and Hypertension in her maternal grandmother. Social History:  reports that she has never smoked. She has never used smokeless tobacco. She reports that she does not drink alcohol or use illicit drugs. Meds Insulin and pnv Allergies nkda No latex Gyn hx negative for stds  ROS is negative   Dilation: 4 Effacement (%): 80 Station: -1 Exam by:: felkel,rn Blood pressure 130/80, pulse 106, temperature 98.7 F (37.1 C), temperature source Oral, resp. rate 20, height 5\' 4"  (1.626 m), weight 113.399 kg (250 lb). @IPILAEXAM @ @PHYSEXAMBYAGE2 @  Prenatal labs: ABO, Rh: A/Positive/-- (02/20 0000) Antibody: Negative (02/20 0000) Rubella:   RPR: Nonreactive (02/20 0000)  HBsAg: Negative (02/20 0000)  HIV: Non-reactive (02/20 0000)  GBS: Positive (08/16 0000)   Assessment/Plan: 38 wks 5 days with A2 DM for induction Penicillin for gbs prophylaxis  Anticipate svd...    Sue Devincenzi J. 09/30/2010, 1:53 PM     CV rrr Lungs clear Abdomen Gravid Ext 2 + edema cx 4/80/-1  FHR baseline 140's good btbv no decels

## 2010-10-01 LAB — CBC
Hemoglobin: 9.9 g/dL — ABNORMAL LOW (ref 12.0–15.0)
MCH: 29 pg (ref 26.0–34.0)
MCHC: 33.6 g/dL (ref 30.0–36.0)
Platelets: 250 10*3/uL (ref 150–400)
RDW: 13.7 % (ref 11.5–15.5)

## 2010-10-01 LAB — GLUCOSE, CAPILLARY
Glucose-Capillary: 127 mg/dL — ABNORMAL HIGH (ref 70–99)
Glucose-Capillary: 211 mg/dL — ABNORMAL HIGH (ref 70–99)
Glucose-Capillary: 90 mg/dL (ref 70–99)

## 2010-10-01 NOTE — Progress Notes (Signed)
Post Partum Day 1 Subjective: no complaints Denies headaches, visual changes. Baby doing well.  Objective: Blood pressure 104/65, pulse 77, temperature 98 F (36.7 C), temperature source Oral, resp. rate 18, height 5\' 4"  (1.626 m), weight 113.399 kg (250 lb), SpO2 99.00%, unknown if currently breastfeeding.  Physical Exam:  General: alert and cooperative  Nursing baby. Lochia: appropriate Uterine Fundus: firm Incision: N/a DVT Evaluation: No evidence of DVT seen on physical exam.  2+ pitting edema. Calf/Ankle edema is present.   Basename 10/01/10 0535 09/30/10 0900  HGB 9.9* 10.2*  HCT 29.5* 30.7*  Fasting CBG 90, Random? 211 at midnight.  Assessment/Plan: Plan for discharge tomorrow and Breastfeeding S/p SVD with shoulder dystocia.  Mother and baby are doing well. GDM A2.  Fasting nl but random elevated.  Repeat AC lunch CBG today. Routine pp care.   LOS: 1 day   Sue Cooper 10/01/2010, 9:36 AM

## 2010-10-01 NOTE — Procedures (Signed)
Addendum to delivery note.    Baby noted to have spontaneous movement of right arm.  Significant flexion of arms and fingers.

## 2010-10-01 NOTE — Anesthesia Postprocedure Evaluation (Signed)
  Anesthesia Post-op Note  Patient: Sue Cooper  Procedure(s) Performed: * No procedures listed *  Patient Location: PACU and Mother/Baby  Anesthesia Type: Epidural  Level of Consciousness: awake, alert  and oriented  Airway and Oxygen Therapy: Patient Spontanous Breathing  Post-op Pain: mild  Post-op Assessment: Post-op Vital signs reviewed and Patient's Cardiovascular Status Stable  Post-op Vital Signs: Reviewed and stable  Complications: No apparent anesthesia complications

## 2010-10-02 MED ORDER — IBUPROFEN 200 MG PO TABS
800.0000 mg | ORAL_TABLET | Freq: Three times a day (TID) | ORAL | Status: AC | PRN
Start: 2010-10-02 — End: 2010-10-12

## 2010-10-02 MED ORDER — OXYCODONE-ACETAMINOPHEN 5-325 MG PO TABS: 1.0000 | ORAL_TABLET | ORAL | Status: AC | PRN

## 2010-10-02 MED ORDER — BENZOCAINE-MENTHOL 20-0.5 % EX AERO
INHALATION_SPRAY | CUTANEOUS | Status: AC
  Filled 2010-10-02: qty 56

## 2010-10-02 MED ORDER — FERROUS GLUCONATE IRON 246 (28 FE) MG PO TABS: 246.0000 mg | ORAL_TABLET | Freq: Every day | ORAL | Status: DC

## 2010-10-02 NOTE — Discharge Summary (Signed)
Obstetric Discharge Summary Reason for Admission: induction of labor Prenatal Procedures: NST, ultrasound Intrapartum Procedures: spontaneous vaginal delivery, Shoulder dystocia Postpartum Procedures: none Complications-Operative and Postpartum: 2 degree perineal laceration Hemoglobin  Date Value Range Status  10/01/2010 9.9* 12.0-15.0 (g/dL) Final     HCT  Date Value Range Status  10/01/2010 29.5* 36.0-46.0 (%) Final    Discharge Diagnoses: Term Pregnancy-delivered  Discharge Information: Date: 10/02/2010 Activity: pelvic rest Diet: routine Medications: Ibuprophen and Percocet Condition: stable Instructions: refer to practice specific booklet Discharge to: home   Newborn Data: Live born female  Birth Weight: 9 lb 5.4 oz (4235 g) APGAR: 8, 9  Home with mother.  Zella Dewan 10/02/2010, 9:00 AM

## 2010-10-02 NOTE — Progress Notes (Signed)
Post Partum Day 2  Subjective: no complaints  Objective: Blood pressure 107/71, pulse 76, temperature 98.4 F (36.9 C), temperature source Oral, resp. rate 18, height 5\' 4"  (1.626 m), weight 113.399 kg (250 lb), SpO2 99.00%, unknown if currently breastfeeding.  Physical Exam:  General: alert and cooperative Lochia: appropriate Uterine Fundus: firm Incision: N/a DVT Evaluation: Calf/Ankle edema is present.   Basename 10/01/10 0535 09/30/10 0900  HGB 9.9* 10.2*  HCT 29.5* 30.7*    Assessment/Plan: Discharge home and Breastfeeding GDM- Stop insulin.  Anemia- Integra daily F/u in 3 weeks.   LOS: 2 days   Jessenia Filippone 10/02/2010, 9:03 AM

## 2010-10-27 LAB — URINALYSIS, ROUTINE W REFLEX MICROSCOPIC
Bilirubin Urine: NEGATIVE
Bilirubin Urine: NEGATIVE
Bilirubin Urine: NEGATIVE
Glucose, UA: 250 — AB
Hgb urine dipstick: NEGATIVE
Hgb urine dipstick: NEGATIVE
Ketones, ur: NEGATIVE
Ketones, ur: NEGATIVE
Nitrite: NEGATIVE
Nitrite: NEGATIVE
Nitrite: NEGATIVE
Protein, ur: NEGATIVE
Specific Gravity, Urine: 1.015
Specific Gravity, Urine: <1.005 — ABNORMAL LOW
Specific Gravity, Urine: <1.005 — ABNORMAL LOW
Urobilinogen, UA: 0.2
Urobilinogen, UA: 0.2
pH: 6
pH: 6.5

## 2010-10-27 LAB — GC/CHLAMYDIA PROBE AMP, URINE
Chlamydia, Swab/Urine, PCR: NEGATIVE
GC Probe Amp, Urine: NEGATIVE

## 2010-10-27 LAB — FETAL FIBRONECTIN: Fetal Fibronectin: NEGATIVE

## 2010-10-27 LAB — STREP B DNA PROBE: Strep Group B Ag: POSITIVE

## 2010-10-30 LAB — CBC
Hemoglobin: 11.5 g/dL — ABNORMAL LOW (ref 12.0–15.0)
MCHC: 34 g/dL (ref 30.0–36.0)
MCV: 91.6 fL (ref 78.0–100.0)
Platelets: 259 10*3/uL (ref 150–400)
Platelets: 288 10*3/uL (ref 150–400)
RBC: 3.11 MIL/uL — ABNORMAL LOW (ref 3.87–5.11)
RDW: 13.5 % (ref 11.5–15.5)
WBC: 16.1 10*3/uL — ABNORMAL HIGH (ref 4.0–10.5)

## 2010-10-30 LAB — GLUCOSE, RANDOM: Glucose, Bld: 91 mg/dL (ref 70–99)

## 2011-05-05 ENCOUNTER — Emergency Department (HOSPITAL_BASED_OUTPATIENT_CLINIC_OR_DEPARTMENT_OTHER)
Admission: EM | Admit: 2011-05-05 | Discharge: 2011-05-05 | Disposition: A | Discharge status: Home / Self Care | Payer: Medicaid Other | Attending: Emergency Medicine | Admitting: Emergency Medicine

## 2011-05-05 ENCOUNTER — Encounter (HOSPITAL_BASED_OUTPATIENT_CLINIC_OR_DEPARTMENT_OTHER): Payer: Self-pay | Admitting: Emergency Medicine

## 2011-05-05 DIAGNOSIS — K644 Residual hemorrhoidal skin tags: Secondary | ICD-10-CM | POA: Insufficient documentation

## 2011-05-05 DIAGNOSIS — K6289 Other specified diseases of anus and rectum: Secondary | ICD-10-CM

## 2011-05-05 MED ORDER — HYDROCORTISONE ACETATE 25 MG RE SUPP
25.0000 mg | Freq: Once | RECTAL | Status: AC
  Administered 2011-05-05: 25 mg via RECTAL

## 2011-05-05 MED ORDER — HYDROCORTISONE ACETATE 25 MG RE SUPP
RECTAL | Status: AC
  Administered 2011-05-05: 25 mg via RECTAL
  Filled 2011-05-05: qty 1

## 2011-05-05 MED ORDER — HYDROCORTISONE ACETATE 25 MG RE SUPP: 25.0000 mg | Freq: Two times a day (BID) | RECTAL | Status: AC

## 2011-05-05 NOTE — ED Notes (Signed)
MD at bedside. 

## 2011-05-05 NOTE — Discharge Instructions (Signed)
Use hydrocortisone suppositories as prescribed. You can also use a barrier cream such as A and D. ointment to help protect the area taped skin around her rectum. Followup with your Dr. for recheck in 3-5 days. Return to the emergency department for worsening condition or new concerning symptoms

## 2011-05-05 NOTE — ED Notes (Signed)
Pt c/o diarrhea and nausea since yesterday

## 2011-05-05 NOTE — ED Provider Notes (Signed)
History     CSN: [id number]  Arrival date & time 05/05/11  0148   First MD Initiated Contact with Patient 05/05/11 [phone]      Chief Complaint  Patient presents with  . Diarrhea  . Nausea    (Consider location/radiation/quality/duration/timing/severity/associated sxs/prior treatment) HPI 26 year old female presents to the emergency department with rectal pain. Patient reports 2 days of persistent loose stool. She reports his having burning pain around her rectum secondary to having diarrhea. Patient has tried Preparation H and Vaseline but is still having pain. She denies any nausea or vomiting, no fevers. Diarrhea is not severe, but has been frequent. She is able to even . She has taken no medications to slow the diarrhea.drink Past Medical History  Diagnosis Date  . Diabetes mellitus   . Gestational diabetes 08/29/2010    Past Surgical History  Procedure Date  . Foot surgery 2007    pin right foot    Family History  Problem Relation Age of Onset  . Diabetes Mother   . Diabetes Father   . Hypertension Maternal Grandmother     History  Substance Use Topics  . Smoking status: Never Smoker   . Smokeless tobacco: Never Used  . Alcohol Use: No    OB History    Grav Para Term Preterm Abortions TAB SAB Ect Mult Living   3 2 2  0 1 1    2       Review of Systems  All other systems reviewed and are negative.    Allergies  Review of patient's allergies indicates no known allergies.  Home Medications   Current Outpatient Rx  Name Route Sig Dispense Refill  . FERROUS GLUCONATE IRON 246 (28 FE) MG PO TABS Oral Take 1 tablet (246 mg total) by mouth daily with breakfast.      Integra one tablet daily by mouth #30 Ref 0  . PRENATAL PLUS 27-1 MG PO TABS Oral Take 1 tablet by mouth daily.        BP 138/90  Pulse 95  Temp(Src) 98.2 F (36.8 C) (Oral)  Resp 18  SpO2 100%  Breastfeeding? Unknown  Physical Exam  Nursing note and vitals reviewed. Constitutional: She  is oriented to person, place, and time. She appears well-developed and well-nourished.       Uncomfortable appearing  Cardiovascular: Normal rate, regular rhythm, normal heart sounds and intact distal pulses.  Exam reveals no gallop and no friction rub.   No murmur heard. Pulmonary/Chest: Effort normal and breath sounds normal. No respiratory distress. She has no wheezes. She has no rales. She exhibits no tenderness.  Abdominal: She exhibits no distension and no mass. There is no tenderness. There is no rebound and no guarding.       Hyperactive bowel sounds  Genitourinary:       Rectal exam shows erythema and mild irritation around the anus. Small hemorrhoid without thrombosis at 6:00  Musculoskeletal: She exhibits no edema and no tenderness.  Neurological: She is alert and oriented to person, place, and time.  Skin: Skin is warm and dry. No rash noted. No erythema. No pallor.    ED Course  Procedures (including critical care time)  Labs Reviewed - No data to display No results found.   No diagnosis found.    MDM  26 year old female with rectal pain after recent diarrheal illness. Will start on hydrocortisone suppositories. Patient advised to use barrier cream such as A&D ointment to the area to help with pain  Olivia Mackie, MD 05/05/11 [phone]

## 2012-05-23 ENCOUNTER — Encounter (HOSPITAL_COMMUNITY): Payer: Self-pay | Admitting: Emergency Medicine

## 2012-05-23 ENCOUNTER — Emergency Department (HOSPITAL_COMMUNITY)
Admission: EM | Admit: 2012-05-23 | Discharge: 2012-05-23 | Disposition: A | Discharge status: Home / Self Care | Payer: 59 | Attending: Emergency Medicine | Admitting: Emergency Medicine

## 2012-05-23 ENCOUNTER — Ambulatory Visit: Payer: 59

## 2012-05-23 DIAGNOSIS — Z79899 Other long term (current) drug therapy: Secondary | ICD-10-CM | POA: Insufficient documentation

## 2012-05-23 DIAGNOSIS — E119 Type 2 diabetes mellitus without complications: Secondary | ICD-10-CM | POA: Insufficient documentation

## 2012-05-23 DIAGNOSIS — H5789 Other specified disorders of eye and adnexa: Secondary | ICD-10-CM | POA: Insufficient documentation

## 2012-05-23 DIAGNOSIS — J3489 Other specified disorders of nose and nasal sinuses: Secondary | ICD-10-CM | POA: Insufficient documentation

## 2012-05-23 DIAGNOSIS — J069 Acute upper respiratory infection, unspecified: Secondary | ICD-10-CM | POA: Insufficient documentation

## 2012-05-23 DIAGNOSIS — Z8632 Personal history of gestational diabetes: Secondary | ICD-10-CM | POA: Insufficient documentation

## 2012-05-23 DIAGNOSIS — J029 Acute pharyngitis, unspecified: Secondary | ICD-10-CM | POA: Insufficient documentation

## 2012-05-23 LAB — RAPID STREP SCREEN (MED CTR MEBANE ONLY): Streptococcus, Group A Screen (Direct): NEGATIVE

## 2012-05-23 NOTE — ED Provider Notes (Signed)
History     CSN: [id number]  Arrival date & time 05/23/12  0919   First MD Initiated Contact with Patient 05/23/12 1019      No chief complaint on file.   (Consider location/radiation/quality/duration/timing/severity/associated sxs/prior treatment) HPI Comments: 27 y.o. Female with PMHx of DM2 presents with sore throat, dry cough, red eyes for one week. Pt denies fever, ear pain, difficulty swallowing, trismus, hemoptysis, nausea, vomiting, shortness of breath. Pt has taken no interventions. Sx have gradually worsened.  Pain is mild, persistent.    Past Medical History  Diagnosis Date  . Diabetes mellitus   . Gestational diabetes 08/29/2010    Past Surgical History  Procedure Laterality Date  . Foot surgery  2007    pin right foot    Family History  Problem Relation Age of Onset  . Diabetes Mother   . Diabetes Father   . Hypertension Maternal Grandmother     History  Substance Use Topics  . Smoking status: Never Smoker   . Smokeless tobacco: Never Used  . Alcohol Use: No    OB History   Grav Para Term Preterm Abortions TAB SAB Ect Mult Living   3 2 2  0 1 1    2       Review of Systems  Constitutional: Negative for fever and diaphoresis.  HENT: Positive for sore throat and rhinorrhea. Negative for ear pain, congestion, sneezing, trouble swallowing, neck pain, neck stiffness, dental problem, voice change and postnasal drip.   Eyes: Negative for visual disturbance.  Respiratory: Negative for apnea, chest tightness and shortness of breath.   Cardiovascular: Negative for chest pain and palpitations.  Gastrointestinal: Negative for nausea, vomiting, diarrhea and constipation.  Genitourinary: Negative for dysuria.  Musculoskeletal: Negative for gait problem.  Skin: Negative for rash.  Neurological: Negative for dizziness, weakness, light-headedness, numbness and headaches.    Allergies  Review of patient's allergies indicates no known allergies.  Home Medications    Current Outpatient Rx  Name  Route  Sig  Dispense  Refill  . LORazepam (ATIVAN) 1 MG tablet   Oral   Take 1 mg by mouth daily as needed for anxiety. anxiety         . EXPIRED: ferrous gluconate (FERGON) 246 (28 FE) MG tablet   Oral   Take 1 tablet (246 mg total) by mouth daily with breakfast.           Integra one tablet daily by mouth #30 Ref 0     BP 135/81  Pulse 89  Temp(Src) 98.5 F (36.9 C) (Oral)  SpO2 100%  Physical Exam  Nursing note and vitals reviewed. Constitutional: She is oriented to person, place, and time. She appears well-developed and well-nourished. No distress.  HENT:  Head: Normocephalic and atraumatic. No trismus in the jaw.  Right Ear: Tympanic membrane normal.  Left Ear: Tympanic membrane normal.  Nose: Rhinorrhea present. No sinus tenderness. Right sinus exhibits no maxillary sinus tenderness and no frontal sinus tenderness. Left sinus exhibits no maxillary sinus tenderness and no frontal sinus tenderness.  Mouth/Throat: No dental abscesses. No oropharyngeal exudate, posterior oropharyngeal edema, posterior oropharyngeal erythema or tonsillar abscesses.  Eyes: Conjunctivae and EOM are normal.  Neck: Normal range of motion. Neck supple.  No meningeal signs  Cardiovascular: Normal rate, regular rhythm and normal heart sounds.  Exam reveals no gallop and no friction rub.   No murmur heard. Pulmonary/Chest: Effort normal and breath sounds normal. No respiratory distress. She has no wheezes. She has no  rales. She exhibits no tenderness.  Abdominal: Soft. Bowel sounds are normal. She exhibits no distension. There is no tenderness. There is no rebound and no guarding.  Musculoskeletal: Normal range of motion. She exhibits no edema and no tenderness.  Neurological: She is alert and oriented to person, place, and time. No cranial nerve deficit.  Skin: Skin is warm and dry. She is not diaphoretic. No erythema.    ED Course  Procedures (including  critical care time)  Labs Reviewed  RAPID STREP SCREEN   No results found.   1. Upper respiratory infection       MDM  Pt afebrile without tonsillar exudate, negative strep. Pt does not appear dehydrated, but did discuss importance of water rehydration. Presentation non concerning for PTA or infxn spread to soft tissue. No trismus or uvula deviation. Specific return precautions discussed.  Patients symptoms are consistent with URI, likely viral etiology. Discussed possible allergic rhinitis given the virulence of the pollen season and to follow up with PCP in that regard. Further discussed that antibiotics are not indicated for viral infections. Pt will be discharged with symptomatic treatment.  Verbalizes understanding and is agreeable with plan. Pt is hemodynamically stable & in NAD prior to dc.    Glade Nurse, PA-C 05/23/12 1635

## 2012-05-23 NOTE — ED Provider Notes (Signed)
History/physical exam/procedure(s) were performed by non-physician practitioner and as supervising physician I was immediately available for consultation/collaboration. I have reviewed all notes and am in agreement with care and plan.   Hilario Quarry, MD 05/23/12 [phone]

## 2012-05-23 NOTE — ED Notes (Signed)
Bed:WA06<BR> Expected date:<BR> Expected time:<BR> Means of arrival:<BR> Comments:<BR>

## 2012-05-23 NOTE — ED Notes (Signed)
Pt sts she has had a sore throat with a "knot" in her throat. Pt has had this for the past week. No drooling or airway concerns noted.

## 2012-05-23 NOTE — ED Notes (Signed)
Bed:WA05<BR> Expected date:<BR> Expected time:<BR> Means of arrival:<BR> Comments:<BR>

## 2013-01-02 ENCOUNTER — Encounter (HOSPITAL_COMMUNITY): Payer: Self-pay | Admitting: Emergency Medicine

## 2013-01-02 ENCOUNTER — Emergency Department (HOSPITAL_COMMUNITY)
Admission: EM | Admit: 2013-01-02 | Discharge: 2013-01-02 | Disposition: A | Discharge status: Home / Self Care | Payer: 59 | Attending: Emergency Medicine | Admitting: Emergency Medicine

## 2013-01-02 DIAGNOSIS — Z3202 Encounter for pregnancy test, result negative: Secondary | ICD-10-CM | POA: Insufficient documentation

## 2013-01-02 DIAGNOSIS — E119 Type 2 diabetes mellitus without complications: Secondary | ICD-10-CM | POA: Diagnosis present

## 2013-01-02 DIAGNOSIS — R110 Nausea: Secondary | ICD-10-CM | POA: Insufficient documentation

## 2013-01-02 LAB — COMPREHENSIVE METABOLIC PANEL
CO2: 23 mEq/L (ref 19–32)
Calcium: 10 mg/dL (ref 8.4–10.5)
Creatinine, Ser: 0.63 mg/dL (ref 0.50–1.10)
GFR calc Af Amer: >90 mL/min (ref >90)
GFR calc non Af Amer: >90 mL/min (ref >90)
Glucose, Bld: 578 mg/dL (ref 70–99)
Total Protein: 8.4 g/dL — ABNORMAL HIGH (ref 6.0–8.3)

## 2013-01-02 LAB — CBC WITH DIFFERENTIAL/PLATELET
HCT: 39.2 % (ref 36.0–46.0)
Hemoglobin: 13.8 g/dL (ref 12.0–15.0)
Lymphocytes Relative: 31 % (ref 12–46)
Lymphs Abs: 3.9 10*3/uL (ref 0.7–4.0)
Monocytes Relative: 7 % (ref 3–12)
Neutro Abs: 7.8 10*3/uL — ABNORMAL HIGH (ref 1.7–7.7)
Neutrophils Relative %: 62 % (ref 43–77)
RBC: 4.67 MIL/uL (ref 3.87–5.11)

## 2013-01-02 LAB — URINALYSIS, ROUTINE W REFLEX MICROSCOPIC
Ketones, ur: 40 mg/dL — AB
Leukocytes, UA: NEGATIVE
Nitrite: NEGATIVE
Specific Gravity, Urine: 1.036 — ABNORMAL HIGH (ref 1.005–1.030)
pH: 6 (ref 5.0–8.0)

## 2013-01-02 LAB — POCT PREGNANCY, URINE: Preg Test, Ur: NEGATIVE

## 2013-01-02 LAB — GLUCOSE, CAPILLARY
Glucose-Capillary: 558 mg/dL (ref 70–99)
Glucose-Capillary: 388 mg/dL — ABNORMAL HIGH (ref 70–99)

## 2013-01-02 LAB — URINE MICROSCOPIC-ADD ON: Urine-Other: NONE SEEN

## 2013-01-02 LAB — HEMOGLOBIN A1C: Mean Plasma Glucose: 272 mg/dL — ABNORMAL HIGH (ref <117)

## 2013-01-02 MED ORDER — METFORMIN HCL ER 500 MG PO TB24: 500.0000 mg | ORAL_TABLET | Freq: Every day | ORAL | Status: DC

## 2013-01-02 MED ORDER — SODIUM CHLORIDE 0.9 % IV BOLUS (SEPSIS)
2000.0000 mL | Freq: Once | INTRAVENOUS | Status: AC
  Administered 2013-01-02: 2000 mL via INTRAVENOUS

## 2013-01-02 NOTE — ED Notes (Signed)
Pt reports to ED for dehydration, polyuria, polydipsia, weakness, and hyperglycemia when she self-tested at home. Pt denies dizziness, sweats, confusion.

## 2013-01-02 NOTE — ED Provider Notes (Signed)
I saw and evaluated the patient, reviewed the resident's note and I agree with the findings and plan.  EKG Interpretation   None      This 27 year old female has a history of gestational diabetes, but no special diet or diabetes medicines since that time, she now presents with polydipsia polyuria and hyperglycemia without altered mental status vomiting or abdominal pain today or other concerns.  Hurman Horn, MD 01/04/13 [phone]

## 2013-01-02 NOTE — ED Provider Notes (Signed)
CSN: [id number]     Arrival date & time 01/02/13  1311 History   First MD Initiated Contact with Patient 01/02/13 1326     No chief complaint on file.  (Consider location/radiation/quality/duration/timing/severity/associated sxs/prior Treatment) HPI Comments: Sue Cooper is a 27 year old woman with a PMH of gestational DM (2009 - treated with oral medication; and 2012 - treated with insulin).  She presents with a three day history of increased thirst and urination.  Her mom was concerned about these symptoms so Sue Cooper checked her CBG using her mom's glucometer and the machine read HIGH.  This was confirmed on her father's glucometer as well so she came to the ED.  Sue Cooper also reports nausea on Saturday that has since resolved.  She experienced blurry vision the other day while wearing her eyeglasses but notes this has resolved since she switched back to her contact lenses (which she normally wears and which have a newer prescription).  She denies cold symptoms, fever, vomiting, diarrhea, abdominal pain, headache or change in appetite.    Past Medical History  Diagnosis Date  . Diabetes mellitus   . Gestational diabetes 08/29/2010   Past Surgical History  Procedure Laterality Date  . Foot surgery  2007    pin right foot   Family History  Problem Relation Age of Onset  . Diabetes Mother   . Diabetes Father   . Hypertension Maternal Grandmother    History  Substance Use Topics  . Smoking status: Never Smoker   . Smokeless tobacco: Never Used  . Alcohol Use: No   OB History   Grav Para Term Preterm Abortions TAB SAB Ect Mult Living   3 2 2  0 1 1    2      Review of Systems  Constitutional: Negative for fever, chills and appetite change.  HENT: Negative for congestion, rhinorrhea, sneezing and sore throat.   Respiratory: Negative for cough and shortness of breath.   Cardiovascular: Negative for chest pain and leg swelling.  Gastrointestinal: Positive for nausea. Negative for  vomiting, diarrhea and constipation.  Endocrine: Positive for polydipsia and polyuria. Negative for polyphagia.  Neurological: Negative for headaches.  Psychiatric/Behavioral: Negative for confusion.    Allergies  Review of patient's allergies indicates no known allergies.  Home Medications   Current Outpatient Rx  Name  Route  Sig  Dispense  Refill  . LORazepam (ATIVAN) 1 MG tablet   Oral   Take 1 mg by mouth daily as needed for anxiety. anxiety          BP 153/87  Pulse 83  Temp(Src) 98.2 F (36.8 C) (Oral)  Resp 18  SpO2 100% Physical Exam  Constitutional: She is oriented to person, place, and time. She appears well-developed. No distress.  HENT:  Mouth/Throat: Oropharynx is clear and moist. No oropharyngeal exudate.  Eyes: EOM are normal. Pupils are equal, round, and reactive to light. Right eye exhibits no discharge. Left eye exhibits no discharge. No scleral icterus.  Neck: Neck supple.  Cardiovascular: Normal rate, regular rhythm and normal heart sounds.   Pulmonary/Chest: Effort normal and breath sounds normal. No respiratory distress. She has no wheezes. She has no rales.  Abdominal: Soft. Bowel sounds are normal. She exhibits no distension. There is no tenderness.  Musculoskeletal: Normal range of motion. She exhibits no edema and no tenderness.  Lymphadenopathy:    She has no cervical adenopathy.  Neurological: She is alert and oriented to person, place, and time. No cranial nerve  deficit.  Skin: Skin is warm. She is not diaphoretic.  Psychiatric: She has a normal mood and affect. Her behavior is normal.    ED Course  Procedures (including critical care time) Labs Review Labs Reviewed  GLUCOSE, CAPILLARY - Abnormal; Notable for the following:    Glucose-Capillary 558 (*)    All other components within normal limits   Imaging Review No results found.  EKG Interpretation   None       MDM  27 year old woman with PMH of gestational DM presents with  a three day history of frequent thirst and urination and a home blood glucose reading too high for the machine to read.  CBG 558 in ED and presentation is consistent with DM likely type 2 given strong family history of DM type 2 (including both parents), history of gestational DM, her increased weight and abdominal adiposity.   Will give 3L NSS boluses; obtain CMP, CBC, Hgb A1c, urine pregnancy and UA.  She has no AG; K, Cr normal.  Urine pregnancy negative.  No evidence of UTI.  Mildly elevated ALT, 63.     She is stable for d/c home with rx for Metformin 500mg  ER once daily (PCP may up titrate by 500mg  weekly to maintenance dose).  She has been instructed to follow-up with her PCP for further management of her DM and to re-check her liver enzymes.  She has been instructed to return to ED with new symptoms including confusion, lethargy, fever, abdominal pain or N/V.  Evelena Peat, DO 01/02/13 [phone]

## 2013-01-02 NOTE — ED Notes (Signed)
MD at bedside. 

## 2013-01-06 ENCOUNTER — Inpatient Hospital Stay (HOSPITAL_COMMUNITY)
Admission: EM | Admit: 2013-01-06 | Discharge: 2013-01-08 | DRG: 638 | Disposition: A | Discharge status: Home / Self Care | Payer: 59 | Attending: Internal Medicine | Admitting: Internal Medicine

## 2013-01-06 ENCOUNTER — Encounter (HOSPITAL_COMMUNITY): Payer: Self-pay | Admitting: Emergency Medicine

## 2013-01-06 DIAGNOSIS — F1010 Alcohol abuse, uncomplicated: Secondary | ICD-10-CM | POA: Diagnosis present

## 2013-01-06 DIAGNOSIS — Z6841 Body Mass Index (BMI) 40.0 and over, adult: Secondary | ICD-10-CM

## 2013-01-06 DIAGNOSIS — E861 Hypovolemia: Secondary | ICD-10-CM | POA: Diagnosis present

## 2013-01-06 DIAGNOSIS — E1310 Other specified diabetes mellitus with ketoacidosis without coma: Principal | ICD-10-CM | POA: Diagnosis present

## 2013-01-06 DIAGNOSIS — B370 Candidal stomatitis: Secondary | ICD-10-CM | POA: Diagnosis present

## 2013-01-06 DIAGNOSIS — E1100 Type 2 diabetes mellitus with hyperosmolarity without nonketotic hyperglycemic-hyperosmolar coma (NKHHC): Secondary | ICD-10-CM | POA: Diagnosis present

## 2013-01-06 DIAGNOSIS — E871 Hypo-osmolality and hyponatremia: Secondary | ICD-10-CM

## 2013-01-06 DIAGNOSIS — O99210 Obesity complicating pregnancy, unspecified trimester: Secondary | ICD-10-CM | POA: Diagnosis present

## 2013-01-06 DIAGNOSIS — O24419 Gestational diabetes mellitus in pregnancy, unspecified control: Secondary | ICD-10-CM

## 2013-01-06 DIAGNOSIS — E1110 Type 2 diabetes mellitus with ketoacidosis without coma: Secondary | ICD-10-CM

## 2013-01-06 DIAGNOSIS — Z833 Family history of diabetes mellitus: Secondary | ICD-10-CM

## 2013-01-06 DIAGNOSIS — R739 Hyperglycemia, unspecified: Secondary | ICD-10-CM | POA: Diagnosis present

## 2013-01-06 DIAGNOSIS — Z794 Long term (current) use of insulin: Secondary | ICD-10-CM

## 2013-01-06 DIAGNOSIS — E869 Volume depletion, unspecified: Secondary | ICD-10-CM

## 2013-01-06 DIAGNOSIS — Z23 Encounter for immunization: Secondary | ICD-10-CM

## 2013-01-06 DIAGNOSIS — E669 Obesity, unspecified: Secondary | ICD-10-CM

## 2013-01-06 DIAGNOSIS — F10129 Alcohol abuse with intoxication, unspecified: Secondary | ICD-10-CM

## 2013-01-06 DIAGNOSIS — Z79899 Other long term (current) drug therapy: Secondary | ICD-10-CM

## 2013-01-06 DIAGNOSIS — Z8249 Family history of ischemic heart disease and other diseases of the circulatory system: Secondary | ICD-10-CM

## 2013-01-06 HISTORY — DX: Alcohol abuse with intoxication, unspecified: F10.129

## 2013-01-06 LAB — BLOOD GAS, VENOUS
Acid-base deficit: 0.7 mmol/L (ref 0.0–2.0)
Bicarbonate: 26.7 mEq/L — ABNORMAL HIGH (ref 20.0–24.0)
FIO2: 0.21 %
O2 Saturation: 28.1 %
Patient temperature: 98.6
pO2, Ven: 0 mmHg — CL (ref 30.0–45.0)

## 2013-01-06 LAB — COMPREHENSIVE METABOLIC PANEL
Albumin: 4.2 g/dL (ref 3.5–5.2)
Alkaline Phosphatase: 117 U/L (ref 39–117)
BUN: 10 mg/dL (ref 6–23)
Creatinine, Ser: 0.68 mg/dL (ref 0.50–1.10)
Potassium: 4.6 mEq/L (ref 3.5–5.1)
Total Protein: 8.7 g/dL — ABNORMAL HIGH (ref 6.0–8.3)

## 2013-01-06 LAB — GLUCOSE, CAPILLARY
Glucose-Capillary: 325 mg/dL — ABNORMAL HIGH (ref 70–99)
Glucose-Capillary: 272 mg/dL — ABNORMAL HIGH (ref 70–99)

## 2013-01-06 LAB — URINALYSIS, ROUTINE W REFLEX MICROSCOPIC
Glucose, UA: >1000 mg/dL — AB
Leukocytes, UA: NEGATIVE
Specific Gravity, Urine: 1.041 — ABNORMAL HIGH (ref 1.005–1.030)
pH: 5.5 (ref 5.0–8.0)

## 2013-01-06 LAB — CBC WITH DIFFERENTIAL/PLATELET
Basophils Relative: 0 % (ref 0–1)
Eosinophils Absolute: 0 10*3/uL (ref 0.0–0.7)
Hemoglobin: 14.3 g/dL (ref 12.0–15.0)
MCH: 30 pg (ref 26.0–34.0)
MCHC: 35.8 g/dL (ref 30.0–36.0)
Monocytes Relative: 8 % (ref 3–12)
Neutrophils Relative %: 81 % — ABNORMAL HIGH (ref 43–77)

## 2013-01-06 LAB — PREGNANCY, URINE: Preg Test, Ur: NEGATIVE

## 2013-01-06 MED ORDER — SODIUM CHLORIDE 0.9 % IV BOLUS (SEPSIS)
1000.0000 mL | Freq: Once | INTRAVENOUS | Status: AC
  Administered 2013-01-06: 1000 mL via INTRAVENOUS

## 2013-01-06 MED ORDER — INSULIN ASPART 100 UNIT/ML IV SOLN
5.0000 [IU] | Freq: Once | INTRAVENOUS | Status: DC
  Filled 2013-01-06: qty 0.05

## 2013-01-06 MED ORDER — SODIUM CHLORIDE 0.9 % IV SOLN
INTRAVENOUS | Status: DC
  Administered 2013-01-06: 2.7 [IU]/h via INTRAVENOUS
  Filled 2013-01-06: qty 1

## 2013-01-06 MED ORDER — NYSTATIN 100000 UNIT/ML MT SUSP
5.0000 mL | Freq: Four times a day (QID) | OROMUCOSAL | Status: DC
  Administered 2013-01-07 – 2013-01-08 (×6): 500000 [IU] via ORAL
  Filled 2013-01-06 (×10): qty 5

## 2013-01-06 MED ORDER — INSULIN ASPART 100 UNIT/ML IV SOLN
5.0000 [IU] | Freq: Once | INTRAVENOUS | Status: AC
  Administered 2013-01-06: 5 [IU] via INTRAVENOUS
  Filled 2013-01-06: qty 0.05

## 2013-01-06 NOTE — ED Notes (Signed)
Critical glucose 654 reported to primary RN, Irving Burton

## 2013-01-06 NOTE — ED Notes (Signed)
She states she has been feeling "woozy" and weak recently.  She was recently found to have hyperglycemia and was prescribed Metformin.  She also c/o mild u.r.i. Sx.  She is orieneted x 3 and is in no distress.

## 2013-01-06 NOTE — ED Notes (Signed)
Place pillow underneath pt arm to increase flow of IV. Will recheck shortly.

## 2013-01-06 NOTE — ED Notes (Signed)
CBG 246 Rate of insulin infusion changed, see MAR Patient denies complaints or needs at this time Patient in NAD Side rails up, call bell in reach

## 2013-01-06 NOTE — ED Notes (Signed)
Pt  Reports excessive thirst and dry mouth.

## 2013-01-06 NOTE — ED Notes (Signed)
Message sent to Pharmacy r/t insulin not in Pyxis

## 2013-01-06 NOTE — H&P (Signed)
[facility] History and Physical  Sue Cooper:[id number] DOB: Jun 17, 1985    PCP:   Leanor Rubenstein, MD   Chief Complaint: feeling malaise, with elevated BS.  HPI: Sue Cooper is an 27 y.o. female  with multiple medical problems including gestastional DM, morbid obesity, seen in the ER last week with elevated BS and was given Metformin at 500mg , which she said she was compliance, returned to the ER complaining of feeling malaise.  She was found to be volume depleted, having hyperosmolar, and mild DKA with n DKA with venous pH of 7.2 and bicarbonate of 23. Further evaluation included a negative UA, blood glucose greater than 600, with concomitant Na of 124, creatinine of 0.68 and potassium of 4.6     . Patient is alert and oriented and able to give a good history. There has been no fever, chills, cough, dysuria. Hospitalist was asked to admit her for DKA.   Rewiew of Systems:  Constitutional: Negative for malaise, fever and chills. No significant weight loss or weight gain Eyes: Negative for eye pain, redness and discharge, diplopia, visual changes, or flashes of light. ENMT: Negative for ear pain, hoarseness, nasal congestion, sinus pressure and sore throat. No headaches; tinnitus, drooling, or problem swallowing. Cardiovascular: Negative for chest pain, palpitations, diaphoresis, dyspnea and peripheral edema. ; No orthopnea, PND Respiratory: Negative for cough, hemoptysis, wheezing and stridor. No pleuritic chestpain. Gastrointestinal: Negative for diarrhea, constipation,  melena, blood in stool, hematemesis, jaundice and rectal bleeding.    Genitourinary: Negative for frequency, dysuria, incontinence,flank pain and hematuria; Musculoskeletal: Negative for back pain and neck pain. Negative for swelling and trauma.;  Skin: . Negative for pruritus, rash, abrasions, bruising and skin lesion.; ulcerations Neuro: Negative for headache, lightheadedness and neck stiffness. Negative  for weakness, altered level of consciousness , altered mental status, extremity weakness, burning feet, involuntary movement, seizure and syncope.  Psych: negative for depression, insomnia, tearfulness, panic attacks, hallucinations, paranoia, suicidal or homicidal ideation    Past Medical History  Diagnosis Date  . Diabetes mellitus   . Gestational diabetes 08/29/2010    Past Surgical History  Procedure Laterality Date  . Foot surgery  2007    pin right foot    Medications:  HOME MEDS: Prior to Admission medications   Medication Sig Start Date End Date Taking? Authorizing Provider  guaifenesin (HUMIBID E) 400 MG TABS tablet Take 400 mg by mouth every 4 (four) hours.   Yes Historical Provider, MD  HYDROcodone-homatropine (HYCODAN) 5-1.5 MG/5ML syrup Take 5 mLs by mouth every 6 (six) hours as needed for cough.   Yes Historical Provider, MD  LORazepam (ATIVAN) 1 MG tablet Take 1 mg by mouth daily as needed for anxiety. anxiety   Yes Historical Provider, MD  metFORMIN (GLUCOPHAGE-XR) 500 MG 24 hr tablet Take 1 tablet (500 mg total) by mouth daily with breakfast. 01/02/13  Yes Evelena Peat, DO     Allergies:  No Known Allergies  Social History:   reports that she has never smoked. She has never used smokeless tobacco. She reports that she does not drink alcohol or use illicit drugs.  Family History: Family History  Problem Relation Age of Onset  . Diabetes Mother   . Diabetes Father   . Hypertension Maternal Grandmother      Physical Exam: Filed Vitals:   01/06/13 1723 01/06/13 1906 01/06/13 2043  BP: 144/93 106/57 117/97  Pulse: 106 70 79  Temp: 98.4 F (36.9 C)  98.2 F (36.8 C)  TempSrc: Oral  Oral  Resp: 16 18 18   SpO2: 98% 98% 99%   Blood pressure 117/97, pulse 79, temperature 98.2 F (36.8 C), temperature source Oral, resp. rate 18, last menstrual period 12/11/2012, SpO2 99.00%.  GEN:  Pleasant patient lying in the stretcher in no acute distress; cooperative  with exam. PSYCH:  alert and oriented x4; does not appear anxious or depressed; affect is appropriate. HEENT: Mucous membranes pink and anicteric; PERRLA; EOM intact; no cervical lymphadenopathy nor thyromegaly or carotid bruit; no JVD; There were no stridor. Neck is very supple. There is oral candidiasis Breasts:: Not examined CHEST WALL: No tenderness CHEST: Normal respiration, clear to auscultation bilaterally.  HEART: Regular rate and rhythm.  There are no murmur, rub, or gallops.   BACK: No kyphosis or scoliosis; no CVA tenderness ABDOMEN: soft and non-tender; no masses, no organomegaly, normal abdominal bowel sounds; no pannus; no intertriginous candida. There is no rebound and no distention. Rectal Exam: Not done EXTREMITIES: No bone or joint deformity; age-appropriate arthropathy of the hands and knees; no edema; no ulcerations.  There is no calf tenderness. Genitalia: not examined PULSES: 2+ and symmetric SKIN: Normal hydration no rash or ulceration CNS: Cranial nerves 2-12 grossly intact no focal lateralizing neurologic deficit.  Speech is fluent; uvula elevated with phonation, facial symmetry and tongue midline. DTR are normal bilaterally, cerebella exam is intact, barbinski is negative and strengths are equaled bilaterally.  No sensory loss.   Labs on Admission:  Basic Metabolic Panel:  Recent Labs Lab 01/02/13 1337 01/06/13 1747  NA 126* 124*  K 3.8 4.6  CL 89* 84*  CO2 23 23  GLUCOSE 578* 654*  BUN 8 10  CREATININE 0.63 0.68  CALCIUM 10.0 10.2   Liver Function Tests:  Recent Labs Lab 01/02/13 1337 01/06/13 1747  AST 35 25  ALT 63* 49*  ALKPHOS 109 117  BILITOT 0.4 0.5  PROT 8.4* 8.7*  ALBUMIN 4.3 4.2   No results found for this basename: LIPASE, AMYLASE,  in the last 168 hours No results found for this basename: AMMONIA,  in the last 168 hours CBC:  Recent Labs Lab 01/02/13 1337 01/06/13 1747  WBC 12.5* 14.1*  NEUTROABS 7.8* 11.3*  HGB 13.8 14.3   HCT 39.2 40.0  MCV 83.9 84.0  PLT 313 269   Cardiac Enzymes: No results found for this basename: CKTOTAL, CKMB, CKMBINDEX, TROPONINI,  in the last 168 hours  CBG:  Recent Labs Lab 01/02/13 1320 01/02/13 1513 01/06/13 1722 01/06/13 2028 01/06/13 2115  GLUCAP 558* 388* >600* 383* 325*     Radiological Exams on Admission: No results found.  Assessment/Plan Present on Admission:  . Hyperglycemia . Fluid volume depletion . Obesity . DKA, type 2, not at goal Oral Candidiasis  PLAN:  Will admit patient to general medical floor for very mild DKA.  She mostly a type 2 having volume depletion and hyperosmolar.  I think she will need more metformin and another agent.  Will increase her Metformin to 500mg  BID and add Glucotrol 2.5 mg BID.  For now, will continue her IV insulin drip.  Will give IVF. There is no evidence of infection.   Home medications will be continued.  Will treat her for oral candidiasis, and will check a TSH.    Patient is stable, full code, and will be admitted to [facility].  Other plans as per orders.  Code Status: FULL Unk Lightning, MD. [facility] Pager [phone] 7pm to 7am.  01/06/2013, 9:59 PM

## 2013-01-06 NOTE — ED Notes (Signed)
Spoke with Marchelle Folks, RN on 5th floor Patient can't go to 5th floor, because she will require telemetry while receiving K[facility] infusion Dr. Conley Rolls present and made aware Marchelle Folks to speak with Nursing Supervisor

## 2013-01-06 NOTE — ED Notes (Signed)
EDPA aware of critical glucose

## 2013-01-06 NOTE — ED Notes (Signed)
BG 272 Rate of insulin infusion changed, see MAR Dr. Conley Rolls present in ED and made aware of patient's progress Awaiting assignment of telemetry bed to 4th floor--Dr. Conley Rolls, Charge Nurse aware Patient updated as to POC and delay in obtain floor bed--v/u and denied needs at this time

## 2013-01-06 NOTE — ED Provider Notes (Signed)
CSN: [id number]     Arrival date & time 01/06/13  1707 History   First MD Initiated Contact with Patient 01/06/13 1731     Chief Complaint  Patient presents with  . Hyperglycemia   (Consider location/radiation/quality/duration/timing/severity/associated sxs/prior Treatment) The history is provided by the patient. No language interpreter was used.  Sue Cooper is a 27 y/o F with PMHx of G2P2002 with gestational diabetes presenting to the ED with an episode of hyperglycemia. Patient reported that she was prescribed Metformin 500 mg to take every morning PO on Tuesday, 01/02/2013 where she was seen and assessed regarding episode of hyperglycemia. Patient reported that she took her medication and has been taking Hydrocodone-Homatropine syrup for cough for the past week. Patient reported that at approximately 2:00PM she started to feel weak, mouth became dry. Patient stated that she started to drink a lot of water. Denied blurred vision, sudden loss of vision, numbness, tingling, syncope, chest pain, shortness of breath, difficulty breathing, confusion, headache, nausea, vomiting, abdominal pain. Patient reported that she was diagnosed with gestational diabetes in both pregnancies, stated that she was on insulin during her second pregnancy and stated that she has not taken anything up until this point and stated that she just started metformin Tuesday.  PCP Dr. Wynelle Link   Past Medical History  Diagnosis Date  . Diabetes mellitus   . Gestational diabetes 08/29/2010   Past Surgical History  Procedure Laterality Date  . Foot surgery  2007    pin right foot   Family History  Problem Relation Age of Onset  . Diabetes Mother   . Diabetes Father   . Hypertension Maternal Grandmother    History  Substance Use Topics  . Smoking status: Never Smoker   . Smokeless tobacco: Never Used  . Alcohol Use: No   OB History   Grav Para Term Preterm Abortions TAB SAB Ect Mult Living   3 2 2  0 1 1    2       Review of Systems  Constitutional: Negative for fever and chills.  HENT: Positive for dental problem (Right mandibular wisdom tooth).   Eyes: Negative for visual disturbance.  Respiratory: Negative for chest tightness and shortness of breath.   Cardiovascular: Negative for chest pain.  Gastrointestinal: Negative for nausea, vomiting, abdominal pain and diarrhea.  Genitourinary: Negative for urgency and decreased urine volume.  Neurological: Positive for dizziness and weakness.  All other systems reviewed and are negative.    Allergies  Review of patient's allergies indicates no known allergies.  Home Medications   Current Outpatient Rx  Name  Route  Sig  Dispense  Refill  . guaifenesin (HUMIBID E) 400 MG TABS tablet   Oral   Take 400 mg by mouth every 4 (four) hours.         Marland Kitchen HYDROcodone-homatropine (HYCODAN) 5-1.5 MG/5ML syrup   Oral   Take 5 mLs by mouth every 6 (six) hours as needed for cough.         Marland Kitchen LORazepam (ATIVAN) 1 MG tablet   Oral   Take 1 mg by mouth daily as needed for anxiety. anxiety         . metFORMIN (GLUCOPHAGE-XR) 500 MG 24 hr tablet   Oral   Take 1 tablet (500 mg total) by mouth daily with breakfast.   30 tablet   0    BP 117/97  Pulse 79  Temp(Src) 98.2 F (36.8 C) (Oral)  Resp 18  SpO2 99%  LMP 12/11/2012  Physical Exam  Nursing note and vitals reviewed. Constitutional: She is oriented to person, place, and time. She appears well-developed and well-nourished. No distress.  HENT:  Head: Normocephalic and atraumatic.  Mouth/Throat: No oropharyngeal exudate.  Dry mucous membranes Uvula midline, symmetrical elevation  Wisdom teeth breaking through the gum line on upper and lower aspects of the jaw. Negative abscess formation identified. Negative swelling, erythema, inflammation, drainage, bleeding noted to the gumline. Mild discomfort upon palpation to upper and lower wisdom teeth bilaterally.  Eyes: Conjunctivae and EOM are  normal. Pupils are equal, round, and reactive to light. Right eye exhibits no discharge. Left eye exhibits no discharge.  Neck: Normal range of motion. Neck supple.  Cardiovascular: Normal rate, regular rhythm and normal heart sounds.  Exam reveals no friction rub.   No murmur heard. Pulses:      Radial pulses are 2+ on the right side, and 2+ on the left side.       Dorsalis pedis pulses are 2+ on the right side, and 2+ on the left side.  Pulmonary/Chest: Effort normal and breath sounds normal. No respiratory distress. She has no wheezes. She has no rales.  Musculoskeletal: Normal range of motion.  Full range of motion to upper and lower extremities bilaterally without difficulty noted  Neurological: She is alert and oriented to person, place, and time. She exhibits normal muscle tone. Coordination normal.  Strength 5+/5+ to upper and lower extremities bilaterally with resistance applied, equal distribution Sensation intact with differentiation to sharp and dull touch Follows commands appropriately Responds to questions properly  Skin: Skin is warm and dry. No rash noted. She is not diaphoretic. No erythema.  Psychiatric: She has a normal mood and affect. Her behavior is normal. Thought content normal.    ED Course  Procedures (including critical care time)  This provider reviewed the patient's chart. Patient was seen in the ED with hyperglycemia on Tuesday, 01/02/2013 with CBG measuring 578. Patient was not in DKA. Patient was given 2 L of IV fluids with glucose levels dropping to 388. Patient was discharged with Metformin 500 mg once PO every morning.  Patient reported that she has an appointment with her PCP on Monday, 01/08/2013.   Discussed case and labs with Dr. Cheri Rous who recommended patient to be placed on Glucostabilizer. Order placed.   9:17 PM This provider spoke with Dr. Dayna Ramus regarding patient's history, case, presentation and labs. Dr. Dayna Ramus reported that he will admit the  patient and will assess patient.   Results for orders placed during the hospital encounter of 01/06/13  GLUCOSE, CAPILLARY      Result Value Range   Glucose-Capillary >600 (*) 70 - 99 mg/dL  CBC WITH DIFFERENTIAL      Result Value Range   WBC 14.1 (*) 4.0 - 10.5 K/uL   RBC 4.76  3.87 - 5.11 MIL/uL   Hemoglobin 14.3  12.0 - 15.0 g/dL   HCT 29.5  62.1 - 30.8 %   MCV 84.0  78.0 - 100.0 fL   MCH 30.0  26.0 - 34.0 pg   MCHC 35.8  30.0 - 36.0 g/dL   RDW 65.7  84.6 - 96.2 %   Platelets 269  150 - 400 K/uL   Neutrophils Relative % 81 (*) 43 - 77 %   Neutro Abs 11.3 (*) 1.7 - 7.7 K/uL   Lymphocytes Relative 12  12 - 46 %   Lymphs Abs 1.6  0.7 - 4.0 K/uL   Monocytes  Relative 8  3 - 12 %   Monocytes Absolute 1.1 (*) 0.1 - 1.0 K/uL   Eosinophils Relative 0  0 - 5 %   Eosinophils Absolute 0.0  0.0 - 0.7 K/uL   Basophils Relative 0  0 - 1 %   Basophils Absolute 0.0  0.0 - 0.1 K/uL  COMPREHENSIVE METABOLIC PANEL      Result Value Range   Sodium 124 (*) 135 - 145 mEq/L   Potassium 4.6  3.5 - 5.1 mEq/L   Chloride 84 (*) 96 - 112 mEq/L   CO2 23  19 - 32 mEq/L   Glucose, Bld 654 (*) 70 - 99 mg/dL   BUN 10  6 - 23 mg/dL   Creatinine, Ser 4.09  0.50 - 1.10 mg/dL   Calcium 81.1  8.4 - 91.4 mg/dL   Total Protein 8.7 (*) 6.0 - 8.3 g/dL   Albumin 4.2  3.5 - 5.2 g/dL   AST 25  0 - 37 U/L   ALT 49 (*) 0 - 35 U/L   Alkaline Phosphatase 117  39 - 117 U/L   Total Bilirubin 0.5  0.3 - 1.2 mg/dL   GFR calc non Af Amer >90  >90 mL/min   GFR calc Af Amer >90  >90 mL/min  URINALYSIS, ROUTINE W REFLEX MICROSCOPIC      Result Value Range   Color, Urine YELLOW  YELLOW   APPearance CLEAR  CLEAR   Specific Gravity, Urine 1.041 (*) 1.005 - 1.030   pH 5.5  5.0 - 8.0   Glucose, UA >1000 (*) NEGATIVE mg/dL   Hgb urine dipstick NEGATIVE  NEGATIVE   Bilirubin Urine NEGATIVE  NEGATIVE   Ketones, ur 40 (*) NEGATIVE mg/dL   Protein, ur NEGATIVE  NEGATIVE mg/dL   Urobilinogen, UA 0.2  0.0 - 1.0 mg/dL    Nitrite NEGATIVE  NEGATIVE   Leukocytes, UA NEGATIVE  NEGATIVE  PREGNANCY, URINE      Result Value Range   Preg Test, Ur NEGATIVE  NEGATIVE  URINE MICROSCOPIC-ADD ON      Result Value Range   Squamous Epithelial / LPF RARE  RARE  BLOOD GAS, VENOUS      Result Value Range   FIO2 0.21     Delivery systems ROOM AIR     pH, Ven 7.277  7.250 - 7.300   pCO2, Ven 59.1 (*) 45.0 - 50.0 mmHg   pO2, Ven 0.0 (*) 30.0 - 45.0 mmHg   Bicarbonate 26.7 (*) 20.0 - 24.0 mEq/L   TCO2 25.1  0 - 100 mmol/L   Acid-base deficit 0.7  0.0 - 2.0 mmol/L   O2 Saturation 28.1     Patient temperature 98.6     Collection site VEIN     Drawn by COLLECTED BY NURSE     Sample type VEIN    GLUCOSE, CAPILLARY      Result Value Range   Glucose-Capillary 383 (*) 70 - 99 mg/dL   Comment 1 Documented in Chart     Comment 2 Notify RN    GLUCOSE, CAPILLARY      Result Value Range   Glucose-Capillary 325 (*) 70 - 99 mg/dL     Labs Review Labs Reviewed  GLUCOSE, CAPILLARY - Abnormal; Notable for the following:    Glucose-Capillary >600 (*)    All other components within normal limits  CBC WITH DIFFERENTIAL - Abnormal; Notable for the following:    WBC 14.1 (*)    Neutrophils Relative %  81 (*)    Neutro Abs 11.3 (*)    Monocytes Absolute 1.1 (*)    All other components within normal limits  COMPREHENSIVE METABOLIC PANEL - Abnormal; Notable for the following:    Sodium 124 (*)    Chloride 84 (*)    Glucose, Bld 654 (*)    Total Protein 8.7 (*)    ALT 49 (*)    All other components within normal limits  URINALYSIS, ROUTINE W REFLEX MICROSCOPIC - Abnormal; Notable for the following:    Specific Gravity, Urine 1.041 (*)    Glucose, UA >1000 (*)    Ketones, ur 40 (*)    All other components within normal limits  BLOOD GAS, VENOUS - Abnormal; Notable for the following:    pCO2, Ven 59.1 (*)    pO2, Ven 0.0 (*)    Bicarbonate 26.7 (*)    All other components within normal limits  GLUCOSE, CAPILLARY -  Abnormal; Notable for the following:    Glucose-Capillary 383 (*)    All other components within normal limits  GLUCOSE, CAPILLARY - Abnormal; Notable for the following:    Glucose-Capillary 325 (*)    All other components within normal limits  PREGNANCY, URINE  URINE MICROSCOPIC-ADD ON  TSH   Imaging Review No results found.  EKG Interpretation   None       MDM   1. DKA, type 2, not at goal   2. Fluid volume depletion     Medications  insulin regular (NOVOLIN R,HUMULIN R) 1 Units/mL in sodium chloride 0.9 % 100 mL infusion (2.7 Units/hr Intravenous New Bag/Given 01/06/13 2140)  nystatin (MYCOSTATIN) 100000 UNIT/ML suspension 500,000 Units (not administered)  sodium chloride 0.9 % bolus 1,000 mL (0 mLs Intravenous Stopped 01/06/13 1929)  sodium chloride 0.9 % bolus 1,000 mL (0 mLs Intravenous Stopped 01/06/13 2106)  insulin aspart (novoLOG) injection 5 Units (5 Units Intravenous Given 01/06/13 2004)  sodium chloride 0.9 % bolus 1,000 mL (1,000 mLs Intravenous New Bag/Given 01/06/13 2055)   Filed Vitals:   01/06/13 1723 01/06/13 1906 01/06/13 2043  BP: 144/93 106/57 117/97  Pulse: 106 70 79  Temp: 98.4 F (36.9 C)  98.2 F (36.8 C)  TempSrc: Oral  Oral  Resp: 16 18 18   SpO2: 98% 98% 99%    Patient presenting to the ED with an episode of hyperglycemia, CBG > 600. Patient has history of gestational diabetes in both pregnancies. Patient reported that she was on insulin during her second pregnancy that was 2 years ago and has not taken anything nor monitored anything since then.  Alert and oriented. Heart rate and rhythm normal. Pulses palpable and strong, radial and DP 2+ bilaterally. Lungs clear to auscultation to upper and lower lobes. Unremarkable oral exam - airway intact - wisdom teeth to mandibular and maxillary region noted to be breaking through the gumline with negative findings of acute infection/drainage/bleeding. Negative oral lesions. Negative rashes noted.  Sensation intact with differentiation to sharp and dull touch. Strength intact with resistance applied. Negative neurological deficits noted.  CBC noted mild elevated WBC of 14.1 with negative left shift, negative leukocytosis noted. Urinalysis noted ketones of 40, gross glucose > 1000 in urine - negative signs of infection. Urine pregnancy negative. CBG > 600. CMP noted hyponatremia (124), chloride (84), glucose elevated 654 - anion gap of 17.0 mEq/L. VBG noted elevated bicarb of 26.7.  Patient given 3 L of IV fluids, 5 Units of Novolog IV. Discussed case and labs with Dr.  Cheri Rous who recommended patient be started on glucostabilizer while in ED setting.  Patient had glucose level of 654 upon arrival to the ED. After 2 L of IV fluids, patient dropped to 380, after 5 Units of Novolog CBG was 325. Patient hyperosmolar. Patient placed on glucostabilizer. Discussed case with Dr. Dayna Ramus - patient to be admitted to hospital for hyperglycemia and elevated anion gap - DKA. Discussed labs and plan for admission with patient, patient agreed to plan and understood, patient stable for transfer.   Raymon Mutton, PA-C 01/07/13 1451

## 2013-01-06 NOTE — ED Notes (Signed)
Pt from home c/o hyperglycemia since the 9 th for same. She was placed on metformin and has  Not seen her PCP yet (scheduled Monday).  Pt c/o sore throat and pain in her wisdom tooth.

## 2013-01-07 DIAGNOSIS — E871 Hypo-osmolality and hyponatremia: Secondary | ICD-10-CM

## 2013-01-07 DIAGNOSIS — Z6841 Body Mass Index (BMI) 40.0 and over, adult: Secondary | ICD-10-CM

## 2013-01-07 LAB — BASIC METABOLIC PANEL
BUN: 5 mg/dL — ABNORMAL LOW (ref 6–23)
CO2: 24 mEq/L (ref 19–32)
Calcium: 7.7 mg/dL — ABNORMAL LOW (ref 8.4–10.5)
Creatinine, Ser: 0.64 mg/dL (ref 0.50–1.10)
GFR calc Af Amer: >90 mL/min (ref >90)
GFR calc non Af Amer: >90 mL/min (ref >90)
Glucose, Bld: 198 mg/dL — ABNORMAL HIGH (ref 70–99)
Potassium: 3.6 mEq/L (ref 3.5–5.1)

## 2013-01-07 LAB — GLUCOSE, CAPILLARY
Glucose-Capillary: 187 mg/dL — ABNORMAL HIGH (ref 70–99)
Glucose-Capillary: 161 mg/dL — ABNORMAL HIGH (ref 70–99)
Glucose-Capillary: 232 mg/dL — ABNORMAL HIGH (ref 70–99)

## 2013-01-07 LAB — TSH: TSH: 1.66 u[IU]/mL (ref 0.350–4.500)

## 2013-01-07 LAB — CBC
MCH: 29.7 pg (ref 26.0–34.0)
MCV: 84 fL (ref 78.0–100.0)
RDW: 12.4 % (ref 11.5–15.5)

## 2013-01-07 MED ORDER — DEXTROSE 50 % IV SOLN: 25.0000 mL | INTRAVENOUS | Status: DC | PRN

## 2013-01-07 MED ORDER — INSULIN ASPART 100 UNIT/ML ~~LOC~~ SOLN
4.0000 [IU] | Freq: Three times a day (TID) | SUBCUTANEOUS | Status: DC
  Administered 2013-01-07 – 2013-01-08 (×3): 4 [IU] via SUBCUTANEOUS

## 2013-01-07 MED ORDER — SODIUM CHLORIDE 0.9 % IV SOLN
INTRAVENOUS | Status: DC
  Filled 2013-01-07: qty 1

## 2013-01-07 MED ORDER — METFORMIN HCL 500 MG PO TABS
500.0000 mg | ORAL_TABLET | Freq: Two times a day (BID) | ORAL | Status: DC
  Administered 2013-01-07: 500 mg via ORAL
  Filled 2013-01-07 (×3): qty 1

## 2013-01-07 MED ORDER — GLIPIZIDE 2.5 MG HALF TABLET
2.5000 mg | ORAL_TABLET | Freq: Two times a day (BID) | ORAL | Status: DC
  Administered 2013-01-07: 2.5 mg via ORAL
  Filled 2013-01-07 (×3): qty 1

## 2013-01-07 MED ORDER — SODIUM CHLORIDE 0.9 % IV SOLN
INTRAVENOUS | Status: DC
  Administered 2013-01-07 – 2013-01-08 (×2): via INTRAVENOUS

## 2013-01-07 MED ORDER — DEXTROSE-NACL 5-0.45 % IV SOLN: INTRAVENOUS | Status: DC

## 2013-01-07 MED ORDER — INFLUENZA VAC SPLIT QUAD 0.5 ML IM SUSP
0.5000 mL | Freq: Once | INTRAMUSCULAR | Status: AC
  Administered 2013-01-07: 0.5 mL via INTRAMUSCULAR
  Filled 2013-01-07 (×2): qty 0.5

## 2013-01-07 MED ORDER — PNEUMOCOCCAL VAC POLYVALENT 25 MCG/0.5ML IJ INJ
0.5000 mL | INJECTION | INTRAMUSCULAR | Status: AC
  Administered 2013-01-08: 0.5 mL via INTRAMUSCULAR
  Filled 2013-01-07 (×2): qty 0.5

## 2013-01-07 MED ORDER — LORAZEPAM 1 MG PO TABS: 1.0000 mg | ORAL_TABLET | Freq: Every day | ORAL | Status: DC | PRN

## 2013-01-07 MED ORDER — POTASSIUM CHLORIDE 10 MEQ/100ML IV SOLN
10.0000 meq | INTRAVENOUS | Status: AC
  Administered 2013-01-07 (×2): 10 meq via INTRAVENOUS
  Filled 2013-01-07 (×2): qty 100

## 2013-01-07 MED ORDER — INSULIN ASPART 100 UNIT/ML ~~LOC~~ SOLN
0.0000 [IU] | Freq: Three times a day (TID) | SUBCUTANEOUS | Status: DC
  Administered 2013-01-07 (×2): 7 [IU] via SUBCUTANEOUS
  Administered 2013-01-07: 15 [IU] via SUBCUTANEOUS
  Administered 2013-01-08: 7 [IU] via SUBCUTANEOUS
  Administered 2013-01-08: 11 [IU] via SUBCUTANEOUS

## 2013-01-07 MED ORDER — INSULIN GLARGINE 100 UNIT/ML ~~LOC~~ SOLN
30.0000 [IU] | Freq: Every day | SUBCUTANEOUS | Status: DC
  Administered 2013-01-07: 30 [IU] via SUBCUTANEOUS
  Filled 2013-01-07 (×2): qty 0.3

## 2013-01-07 MED ORDER — ENOXAPARIN SODIUM 40 MG/0.4ML ~~LOC~~ SOLN
40.0000 mg | SUBCUTANEOUS | Status: DC
  Administered 2013-01-07: 40 mg via SUBCUTANEOUS
  Filled 2013-01-07 (×2): qty 0.4

## 2013-01-07 MED ORDER — SODIUM CHLORIDE 0.9 % IV SOLN: INTRAVENOUS | Status: AC

## 2013-01-07 NOTE — ED Provider Notes (Signed)
Medical screening examination/treatment/procedure(s) were conducted as a shared visit with non-physician practitioner(s) and myself.  I personally evaluated the patient during the encounter.  EKG Interpretation    Date/Time:    Ventricular Rate:    PR Interval:    QRS Duration:   QT Interval:    QTC Calculation:   R Axis:     Text Interpretation:              Sue Cooper is a 27 y.o. female hx of gestational diabetes here with weakness. Weakness today, felt dry. Recently started on metformin. Denies vomiting. CBG 578. AG 14. I am concerned that she may be in mild DKA. I asked PA to order glucostabilizer. She is admitted to stepdown.   CRITICAL CARE Performed by: Silverio Lay, Melvie Paglia   Total critical care time:30 min   Critical care time was exclusive of separately billable procedures and treating other patients.  Critical care was necessary to treat or prevent imminent or life-threatening deterioration.  Critical care was time spent personally by me on the following activities: development of treatment plan with patient and/or surrogate as well as nursing, discussions with consultants, evaluation of patient's response to treatment, examination of patient, obtaining history from patient or surrogate, ordering and performing treatments and interventions, ordering and review of laboratory studies, ordering and review of radiographic studies, pulse oximetry and re-evaluation of patient's condition.    Richardean Canal, MD 01/07/13 1500

## 2013-01-07 NOTE — Progress Notes (Signed)
Utilization Review completed.  

## 2013-01-07 NOTE — ED Notes (Signed)
Per Dr. Conley Rolls, patient does NOT have to be transported to room 1425 on tele monitor

## 2013-01-07 NOTE — Discharge Summary (Addendum)
Physician Discharge Summary  Sue Cooper:[id number] DOB: Nov 22, 1985 DOA: 01/06/2013  PCP: Leanor Rubenstein, MD  Admit date: 01/06/2013 Discharge date: 01/08/2013  Recommendations for Outpatient Follow-up:  1. Pt will need to follow up with PCP in 2 weeks post discharge 2. Please obtain BMP to evaluate electrolytes and kidney function 3. Please also check CBC to evaluate Hg and Hct levels  PLEASE SEE ADDENDUM PROGRESS NOTE ON 01/08/13 AT 5:15pm  Discharge Diagnoses:  Principal Problem:   DKA, type 2 Active Problems:   Hyperglycemia   Fluid volume depletion   Obesity   DKA, type 2, not at goal   Alcohol intoxication in active alcoholic   Morbid obesity   Hyponatremia DKA  -Initial anion gap 17  -Has been transitioned to subcutaneous insulin  -Initial blood sugar>600  -diet advanced and pt tolerated without difficulty Insulin-dependent diabetes mellitus, poorly controlled  -Likely type II  -increase Lantus 40 units at bedtime  -NovoLog sliding scale  -5 units NovoLog with each meal  -Consult diabetic educator for education  -Consult dietitian--recommendations given  -Discontinue glipizide  -Continue metformin at d/c -01/02/2013 hemoglobin A1c 11.1  -The patient was educated that she had to check her sugars before each meal and at bedtime and to take her glycemic log to her primary care provider for adjustment of her insulin -LDL 124-->lifestyle modification for now Morbid obesity  -Lifestyle modification discussed  Hyponatremia  -Secondary to elevated glucose and volume depletion   Discharge Condition: Stable  Disposition:  home  Diet: Carbohydrate modified Wt Readings from Last 3 Encounters:  01/08/13 105.371 kg (232 lb 4.8 oz)  09/30/10 113.399 kg (250 lb)  09/18/10 114.987 kg (253 lb 8 oz)    History of present illness:  27 year old female with history of this disease and diabetes, morbid obesity who presented to the emergency department on 12/09/201,   prior to this admission with polyuria. She was diagnosed with diabetes mellitus and started on metformin 500 mg twice a day.  She endorses compliance with the metformin. However over the past week, she complained of some blurry vision, polyphagia, and dry mouth. As a result compared to the emergency department. She was found to have DKA with an anion gap of 17. Her pH was 7.27. WBC was 14.1. Hemoglobin A1c on 01/02/2013 was 11.1. Urine pregnancy test was negative. The patient was started on intravenous insulin and IV fluids. She was transitioned to subcutaneous insulin. Diabetic educator was consulted to provide education. The patient was consulted to provide dietary advice. She will need a followup with her primary care provider.   Discharge Exam: Filed Vitals:   01/08/13 1100  BP: 97/65  Pulse: 86  Temp: 98 F (36.7 C)  Resp: 18   Filed Vitals:   01/07/13 2108 01/08/13 0217 01/08/13 0624 01/08/13 1100  BP: 109/63 112/69 113/69 97/65  Pulse: 84 86 72 86  Temp: 98.7 F (37.1 C) 98.1 F (36.7 C) 98 F (36.7 C) 98 F (36.7 C)  TempSrc: Oral Oral Oral Oral  Resp: 18 20 20 18   Height:      Weight:   105.371 kg (232 lb 4.8 oz)   SpO2: 99% 100% 100% 100%   General: A&O x 3, NAD, pleasant, cooperative Cardiovascular: RRR, no rub, no gallop, no S3 Respiratory: CTAB, no wheeze, no rhonchi Abdomen:soft, nontender, nondistended, positive bowel sounds Extremities: No edema, No lymphangitis, no petechiae  Discharge Instructions      Discharge Orders   Future Orders Complete By Expires  Ambulatory referral to Nutrition and Diabetic Education  As directed    Diet Carb Modified  As directed    Discharge instructions  As directed    Comments:     Check your sugars before each meal and at bedtime.  Write them down.  Take your sugar log to your primary care physician who will adjust your insulin dosing.   Increase activity slowly  As directed        Medication List          guaifenesin 400 MG Tabs tablet  Commonly known as:  HUMIBID E  Take 400 mg by mouth every 4 (four) hours.     HYDROcodone-homatropine 5-1.5 MG/5ML syrup  Commonly known as:  HYCODAN  Take 5 mLs by mouth every 6 (six) hours as needed for cough.     insulin NPH-regular (70-30) 100 UNIT/ML injection  Commonly known as:  NOVOLIN 70/30  Inject 25 Units into the skin 2 (two) times daily with a meal.     LORazepam 1 MG tablet  Commonly known as:  ATIVAN  Take 1 mg by mouth daily as needed for anxiety. anxiety     metFORMIN 500 MG 24 hr tablet  Commonly known as:  GLUCOPHAGE-XR  Take 1 tablet (500 mg total) by mouth daily with breakfast.         The results of significant diagnostics from this hospitalization (including imaging, microbiology, ancillary and laboratory) are listed below for reference.    Significant Diagnostic Studies: No results found.   Microbiology: No results found for this or any previous visit (from the past 240 hour(s)).   Labs: Basic Metabolic Panel:  Recent Labs Lab 01/02/13 1337 01/06/13 1747 01/07/13 0343 01/08/13 0435  NA 126* 124* 135 137  K 3.8 4.6 3.6 4.6  CL 89* 84* 102 102  CO2 23 23 24 25   GLUCOSE 578* 654* 198* 314*  BUN 8 10 5* 7  CREATININE 0.63 0.68 0.64 0.72  CALCIUM 10.0 10.2 7.7* 9.0   Liver Function Tests:  Recent Labs Lab 01/02/13 1337 01/06/13 1747  AST 35 25  ALT 63* 49*  ALKPHOS 109 117  BILITOT 0.4 0.5  PROT 8.4* 8.7*  ALBUMIN 4.3 4.2   No results found for this basename: LIPASE, AMYLASE,  in the last 168 hours No results found for this basename: AMMONIA,  in the last 168 hours CBC:  Recent Labs Lab 01/02/13 1337 01/06/13 1747 01/07/13 0343  WBC 12.5* 14.1* 8.9  NEUTROABS 7.8* 11.3*  --   HGB 13.8 14.3 11.1*  HCT 39.2 40.0 31.4*  MCV 83.9 84.0 84.0  PLT 313 269 205   Cardiac Enzymes: No results found for this basename: CKTOTAL, CKMB, CKMBINDEX, TROPONINI,  in the last 168 hours BNP: No components  found with this basename: POCBNP,  CBG:  Recent Labs Lab 01/07/13 1158 01/07/13 1702 01/07/13 2105 01/08/13 0632 01/08/13 1137  GLUCAP 325* 232* 188* 240* 284*    Time coordinating discharge:  Greater than 30 minutes  Signed:  Edgardo Petrenko, DO [facility] Pager: [phone] 01/08/2013, 5:15 PM

## 2013-01-07 NOTE — Progress Notes (Signed)
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      Subjective: Patient is feeling better today. She denies fevers, chills, chest discomfort or shortness breath, nausea, vomiting, diarrhea, abdominal pain, dysuria, hematuria, headache, dizziness.  Objective: Filed Vitals:   01/07/13 0031 01/07/13 0210 01/07/13 0426 01/07/13 1433  BP: 113/62 112/69 114/60 117/63  Pulse: 87 82 82 96  Temp: 98.5 F (36.9 C) 98.2 F (36.8 C) 98.7 F (37.1 C) 98.3 F (36.8 C)  TempSrc: Oral Oral Oral Oral  Resp: 24 22 20 18   Height: 5\' 3"  (1.6 m)     Weight: 104.6 kg (230 lb 9.6 oz)  106.051 kg (233 lb 12.8 oz)   SpO2: 100% 100% 100% 100%    Intake/Output Summary (Last 24 hours) at 01/07/13 1543 Last data filed at 01/07/13 1325  Gross per 24 hour  Intake    480 ml  Output      0 ml  Net    480 ml   Weight change:  Exam:   General:  Pt is alert, follows commands appropriately, not in acute distress  HEENT: No icterus, No thrush,[place]/AT  Cardiovascular: RRR, S1/S2, no rubs, no gallops  Respiratory: CTA bilaterally, no wheezing, no crackles, no rhonchi  Abdomen: Soft/+BS, non tender, non distended, no guarding  Extremities: trace LE edema, No  lymphangitis, No petechiae, No rashes, no synovitis  Data Reviewed: Basic Metabolic Panel:  Recent Labs Lab 01/02/13 1337 01/06/13 1747 01/07/13 0343  NA 126* 124* 135  K 3.8 4.6 3.6  CL 89* 84* 102  CO2 23 23 24   GLUCOSE 578* 654* 198*  BUN 8 10 5*  CREATININE 0.63 0.68 0.64  CALCIUM 10.0 10.2 7.7*   Liver Function Tests:  Recent Labs Lab 01/02/13 1337 01/06/13 1747  AST 35 25  ALT 63* 49*  ALKPHOS 109 117  BILITOT 0.4 0.5  PROT 8.4* 8.7*  ALBUMIN 4.3 4.2   No results found for this basename: LIPASE, AMYLASE,  in the last 168 hours No results found for this basename: AMMONIA,  in the last 168 hours CBC:  Recent Labs Lab 01/02/13 1337 01/06/13 1747 01/07/13 0343  WBC 12.5* 14.1* 8.9  NEUTROABS 7.8* 11.3*  --   HGB 13.8 14.3 11.1*  HCT 39.2 40.0 31.4*  MCV 83.9 84.0 84.0  PLT 313 269 205   Cardiac Enzymes: No results found for this basename: CKTOTAL, CKMB, CKMBINDEX, TROPONINI,  in the last 168 hours BNP: No components found with this basename: POCBNP,  CBG:  Recent Labs Lab 01/07/13 0314 01/07/13 0421 01/07/13 0511 01/07/13 0745 01/07/13 1158  GLUCAP 161* 179* 168* 248* 325*    No results found for this or any previous visit (from the past 240 hour(s)).   Scheduled Meds: . enoxaparin (LOVENOX) injection  40 mg Subcutaneous Q24H  . insulin aspart  0-20 Units Subcutaneous TID WC  . insulin aspart  4 Units Subcutaneous  TID WC  . insulin glargine  30 Units Subcutaneous QHS  . nystatin  5 mL Oral QID  . [START ON 01/08/2013] pneumococcal 23 valent vaccine  0.5 mL Intramuscular Tomorrow-1000   Continuous Infusions: . sodium chloride 150 mL/hr at 01/07/13 0351     Mora Pedraza, DO  [facility] Pager [phone]  If 7PM-7AM, please contact night-coverage [email or web address] Password [email or web address] 01/07/2013, 3:43 PM   LOS: 1 day

## 2013-01-08 LAB — LIPID PANEL
HDL: 42 mg/dL (ref >39)
LDL Cholesterol: 124 mg/dL — ABNORMAL HIGH (ref 0–99)
VLDL: 14 mg/dL (ref 0–40)

## 2013-01-08 LAB — BASIC METABOLIC PANEL
BUN: 7 mg/dL (ref 6–23)
CO2: 25 mEq/L (ref 19–32)
Calcium: 9 mg/dL (ref 8.4–10.5)
Creatinine, Ser: 0.72 mg/dL (ref 0.50–1.10)
GFR calc non Af Amer: >90 mL/min (ref >90)
Glucose, Bld: 314 mg/dL — ABNORMAL HIGH (ref 70–99)
Potassium: 4.6 mEq/L (ref 3.5–5.1)

## 2013-01-08 LAB — GLUCOSE, CAPILLARY
Glucose-Capillary: 240 mg/dL — ABNORMAL HIGH (ref 70–99)
Glucose-Capillary: 284 mg/dL — ABNORMAL HIGH (ref 70–99)

## 2013-01-08 MED ORDER — INSULIN PEN STARTER KIT
1.0000 | Freq: Once | Status: AC
  Administered 2013-01-08: 1
  Filled 2013-01-08: qty 1

## 2013-01-08 MED ORDER — LIVING WELL WITH DIABETES BOOK
Freq: Once | Status: AC
  Administered 2013-01-08: 13:00:00
  Filled 2013-01-08: qty 1

## 2013-01-08 MED ORDER — INSULIN GLARGINE 100 UNIT/ML ~~LOC~~ SOLN
40.0000 [IU] | Freq: Every day | SUBCUTANEOUS | Status: DC
  Filled 2013-01-08: qty 0.4

## 2013-01-08 MED ORDER — ACETAMINOPHEN 325 MG PO TABS
650.0000 mg | ORAL_TABLET | Freq: Four times a day (QID) | ORAL | Status: DC | PRN
  Administered 2013-01-08: 650 mg via ORAL
  Filled 2013-01-08: qty 2

## 2013-01-08 MED ORDER — INSULIN GLARGINE 100 UNIT/ML SOLOSTAR PEN: 40.0000 [IU] | PEN_INJECTOR | Freq: Every day | SUBCUTANEOUS | Status: DC

## 2013-01-08 MED ORDER — INSULIN LISPRO 100 UNIT/ML ~~LOC~~ SOLN: 5.0000 [IU] | Freq: Three times a day (TID) | SUBCUTANEOUS | Status: DC

## 2013-01-08 MED ORDER — INSULIN NPH ISOPHANE & REGULAR (70-30) 100 UNIT/ML ~~LOC~~ SUSP: 25.0000 [IU] | Freq: Two times a day (BID) | SUBCUTANEOUS | Status: DC

## 2013-01-08 NOTE — Progress Notes (Signed)
Approximately 2-3 hrs after discharge, RN from floor called me to call [facility] where pt's Rx was sent.  The patient was at [facility] and stated that she was not able to afford the $50 co-pays for Lantus or Humalog. The patient wanted a more affordable insulin. I requested that the pharmacist cancel the prescriptions for Lantus and Humalog. I gave her verbal Rx request for 70/30 insulin, 25 units bid, one vial, one refill.  I also gave verbal Rx request for one box of stock insulin needles and one box of stock insulin syringes with no refills.  DTat

## 2013-01-08 NOTE — Progress Notes (Signed)
Inpatient Diabetes Program Recommendations  AACE/ADA: New Consensus Statement on Inpatient Glycemic Control (2013)  Target Ranges:  Prepandial:   less than 140 mg/dL      Peak postprandial:   less than 180 mg/dL (1-2 hours)      Critically ill patients:  140 - 180 mg/dL   Reason for Visit: Diabetes Consult - New DM (Hx GDM)  Pt has hx GDM presenting to ED with hyperglycemia.  States she was prescribed insulin with GDM and prefers to be discharged on pens rather than syringes. States she will attend OP Diabetes Ed at [facility] and f/u with PCP regarding glycemic control.  Will order Living Well With Diabetes and Insulin Pen Starter Kit. Reviewed insulin pen instructions and pt very familiar with same. Will need Prescription for glucose meter. Pt seems motivated to make lifestyle changes and writer discussed how diet, exercise and stress affects blood sugars.    Results for Sue Cooper, Sue Cooper (MRN [id number]) as of 01/08/2013 13:10  Ref. Range 01/07/2013 11:58 01/07/2013 17:02 01/07/2013 21:05 01/08/2013 06:32 01/08/2013 11:37  Glucose-Capillary Latest Range: 70-99 mg/dL 469 (H) 629 (H) 528 (H) 240 (H) 284 (H)  Results for Sue Cooper, Sue Cooper (MRN [id number]) as of 01/08/2013 13:10  Ref. Range 01/02/2013 13:37  Hemoglobin A1C Latest Range: <5.7 % 11.1 (H)   Inpatient Diabetes Program Recommendations Insulin - Basal: Increase Lantus to 40 units QHS Insulin - Meal Coverage: Increase Novolog to 8 units tidwc for meal coverage insulin Outpatient Referral: Will refer to [facility] for OP Diabetes Education.  Pt had diabetes ed with GDM, but would like to go again  Note: Discussed above with RN. Will order OP Ed. Thank you. Ailene Ards, RD, LDN, CDE Inpatient Diabetes Coordinator [phone]

## 2013-01-08 NOTE — Progress Notes (Signed)
  RD consulted for nutrition education regarding diabetes.   Lab Results  Component Value Date   HGBA1C 11.1* 01/02/2013    RD provided "Carbohydrate Counting for People with Diabetes" handout from the Academy of Nutrition and Dietetics as well as "5- Day 1800-Calorie Sample Menus". Discussed different food groups and their effects on blood sugar, emphasizing carbohydrate-containing foods. Provided list of carbohydrates and recommended serving sizes of common foods.  Discussed importance of controlled and consistent carbohydrate intake throughout the day. Provided examples of ways to balance meals/snacks and encouraged intake of high-fiber, whole grain complex carbohydrates. Encouraged pt to reduced dietary fat intake; pt states she will switch to low-fat milk.Teach back method used.  Expect good compliance. Pt states she has been trying to lose weight and she is looking forward to following the carb modified diet to help her lose more weight.    Body mass index is 41.16 kg/(m^2). Pt meets criteria for Morbid Obesity based on current BMI.   Current diet order is Carbohydrate Modified, patient is consuming approximately 100% of meals at this time. Labs and medications reviewed. No further nutrition interventions warranted at this time. RD contact information provided. If additional nutrition issues arise, please re-consult RD.  Ian Malkin RD, LDN Inpatient Clinical Dietitian Pager: [phone] After Hours Pager: [phone]

## 2013-01-08 NOTE — Progress Notes (Signed)
Patient discharged home, discharge instructions given and explained to patient and she verbalized understanding, denies any pain/distress. Skin intact, no wound.

## 2013-02-27 ENCOUNTER — Ambulatory Visit: Payer: 59 | Admitting: *Deleted

## 2013-03-11 ENCOUNTER — Emergency Department (HOSPITAL_COMMUNITY)
Admission: EM | Admit: 2013-03-11 | Discharge: 2013-03-11 | Disposition: A | Discharge status: Home / Self Care | Payer: Managed Care, Other (non HMO) | Attending: Emergency Medicine | Admitting: Emergency Medicine

## 2013-03-11 ENCOUNTER — Emergency Department (HOSPITAL_COMMUNITY): Payer: Managed Care, Other (non HMO)

## 2013-03-11 ENCOUNTER — Encounter (HOSPITAL_COMMUNITY): Payer: Self-pay | Admitting: Emergency Medicine

## 2013-03-11 DIAGNOSIS — Y93E1 Activity, personal bathing and showering: Secondary | ICD-10-CM | POA: Insufficient documentation

## 2013-03-11 DIAGNOSIS — S8391XA Sprain of unspecified site of right knee, initial encounter: Secondary | ICD-10-CM

## 2013-03-11 DIAGNOSIS — E119 Type 2 diabetes mellitus without complications: Secondary | ICD-10-CM | POA: Insufficient documentation

## 2013-03-11 DIAGNOSIS — Z794 Long term (current) use of insulin: Secondary | ICD-10-CM | POA: Insufficient documentation

## 2013-03-11 DIAGNOSIS — X500XXA Overexertion from strenuous movement or load, initial encounter: Secondary | ICD-10-CM | POA: Insufficient documentation

## 2013-03-11 DIAGNOSIS — Y929 Unspecified place or not applicable: Secondary | ICD-10-CM | POA: Insufficient documentation

## 2013-03-11 DIAGNOSIS — IMO0002 Reserved for concepts with insufficient information to code with codable children: Secondary | ICD-10-CM | POA: Insufficient documentation

## 2013-03-11 NOTE — Discharge Instructions (Signed)
Xrays of the right knee did not show any broken bones or dislocations  Please call your doctor for a followup appointment within 24-48 hours. When you talk to your doctor please let them know that you were seen in the emergency department and have them acquire all of your records so that they can discuss the findings with you and formulate a treatment plan to fully care for your new and ongoing problems. Please call and set-up an appointment with your primary care provider  Please keep knee sleeve on when active and use crutches as needed when active Please rest, ice, elevated - toes above nose Can use Ibuprofen as needed for pain - please no more than 1200 mg per day  Please avoid any physical or strenuous activity Please continue to monitor symptoms closely and if symptoms are to worsen or change (fever greater than 101, chills, sweating, numbness, tingling, swelling, redness to the knee, warmth to the touch) please report back to the ED immediately   Joint Sprain A sprain is a tear or stretch in the ligaments that hold a joint together. Severe sprains may need as long as 3-6 weeks of immobilization and/or exercises to heal completely. Sprained joints should be rested and protected. If not, they can become unstable and prone to re-injury. Proper treatment can reduce your pain, shorten the period of disability, and reduce the risk of repeated injuries. TREATMENT   Rest and elevate the injured joint to reduce pain and swelling.  Apply ice packs to the injury for 20-30 minutes every 2-3 hours for the next 2-3 days.  Keep the injury wrapped in a compression bandage or splint as long as the joint is painful or as instructed by your caregiver.  Do not use the injured joint until it is completely healed to prevent re-injury and chronic instability. Follow the instructions of your caregiver.  Long-term sprain management may require exercises and/or treatment by a physical therapist. Taping or special  braces may help stabilize the joint until it is completely better. SEEK MEDICAL CARE IF:   You develop increased pain or swelling of the joint.  You develop increasing redness and warmth of the joint.  You develop a fever.  It becomes stiff.  Your hand or foot gets cold or numb. Document Released: 02/19/2004 Document Revised: 04/05/2011 Document Reviewed: 01/29/2008 [facility] Patient Information 2014 [place], [place].   Emergency Department Resource Guide 1) Find a Doctor and Pay Out of Pocket Although you won't have to find out who is covered by your insurance plan, it is a good idea to ask around and get recommendations. You will then need to call the office and see if the doctor you have chosen will accept you as a new patient and what types of options they offer for patients who are self-pay. Some doctors offer discounts or will set up payment plans for their patients who do not have insurance, but you will need to ask so you aren't surprised when you get to your appointment.  2) Contact Your Local Health Department Not all health departments have doctors that can see patients for sick visits, but many do, so it is worth a call to see if yours does. If you don't know where your local health department is, you can check in your phone book. The CDC also has a tool to help you locate your state's health department, and many state websites also have listings of all of their local health departments.  3) Find a [employer]  If your illness is not likely to be very severe or complicated, you may want to try a walk in clinic. These are popping up all over the country in pharmacies, drugstores, and shopping centers. They're usually staffed by nurse practitioners or physician assistants that have been trained to treat common illnesses and complaints. They're usually fairly quick and inexpensive. However, if you have serious medical issues or chronic medical problems, these are probably not your  best option.  No Primary Care Doctor: - Call Health Connect at  [phone] - they can help you locate a primary care doctor that  accepts your insurance, provides certain services, etc. - Physician Referral Service- [phone]  Chronic Pain Problems: Organization         Address  Phone   Notes  [facility]  [phone] Patients need to be referred by their primary care doctor.   Medication Assistance: Organization         Address  Phone   Notes  [facility] Medication [facility] [address] [place] [place], [place] [address] [phone] --Must be a resident of [facility] -- Must have NO insurance coverage whatsoever (no Medicaid/ Medicare, etc.) -- The pt. MUST have a primary care doctor that directs their care regularly and follows them in the community   [facility]  [phone]   [employer]  [phone]    Agencies that provide inexpensive medical care: Organization         Address  Phone   Notes  [facility]  [phone]   [facility]    [phone]   [facility] [address] [place], [place] [address] [phone]   [facility] [address] [place] [place] [phone]   Planned Parenthood    [phone]   [facility]    [phone]   [facility]  [address], [place] Phone:  [phone], Fax:  [phone] Hours of Operation:  9 am - 6 pm, M-F.  Also accepts Medicaid/Medicare and self-pay.  [facility]  [address], [place] Phone: [phone], Fax: [phone]. Hours of Operation:  8:30 am - 5:30 pm, M-F.  Also accepts Medicaid and self-pay.  [facility] [place] [address], [place] Phone: [phone]   [facility] [address] Natasha Bence [place], [place] [phone], Ext. [phone] Mondays & Thursdays: 7-9 AM.  First 15  patients are seen on a first come, first serve basis.    Medicaid-accepting [facility] Providers:  Organization         Address  Phone   Notes  [facility] [address], [place] [phone] Also accepts self-pay patients.  [facility] [address] Laurell Josephs [place], [place]  [phone]   [facility] [address], [place] [phone]   [facility] [address] [place] [phone]   Renaye Rakers [address], [place]   [phone] Only accepts [place] patients after they have their name applied to their card.   Self-Pay (no insurance) in [facility]:  Organization         Address  Phone   Notes  Sickle Cell Patients, [facility] [address] [place], [place] [phone])  [phone]   [facility] [address] [place], [place] [phone]   [facility] [place]  [address], [place] [phone]   [facility]/Dr. Osei-Bonsu  [address] [employer] or [address], [place] [phone] Phone number for both [place] and [place] locations is the same.  [facility] [address], [place] [phone]   [facility] [address], [place] or [address] [phone]   [facility] [address], [place] [phone], phone; [phone], fax Sees patients 1st and 3rd Saturday of every month.  Must not qualify for public or private insurance (i.e. Medicaid, Medicare, [place] Health Choice, Veterans' Benefits)  Household income should be no more than 200% of the poverty level The clinic cannot treat you if you are pregnant or think you are pregnant  Sexually transmitted diseases are not treated at the clinic.    Dental  Care: Organization         Address  Phone  Notes  [facility] [address] [place], [place] [phone] Accepts children up to age 66 who are enrolled in [place] or [place] Health Choice; pregnant women with a Medicaid card; and children who have applied for Medicaid or [place] Health Choice, but were declined, whose parents can pay a reduced fee at time of service.  [facility]  [address], [place] [phone] Accepts children up to age 83 who are enrolled in [place] or [place] Health Choice; pregnant women with a Medicaid card; and children who have applied for Medicaid or [place] Health Choice, but were declined, whose parents can pay a reduced fee at time of service.  [facility]  [address] [place], [place] [phone] Patients are seen by appointment only. Walk-ins are not accepted. [facility] will see patients 2 years of age and older. Monday - Tuesday (8am-5pm) Most Wednesdays (8:30-5pm) $30 per visit, cash only  [facility]  [address], [facility] [phone] Patients are seen by appointment only. Walk-ins are not accepted. [facility] will see patients 23 years of age and older. One Wednesday Evening (Monthly: Volunteer Based).  $30 per visit, cash only  [employer]  [phone] for adults; Children under age 55, call Graduate Pediatric Dentistry at [phone]. Children aged 4-14, please call [phone] to request a pediatric application.  Dental services are provided in all areas of dental care including fillings, crowns and bridges, complete and partial dentures, implants, gum treatment, root canals, and extractions. Preventive care is also provided. Treatment is provided to both adults and children. Patients are selected via a lottery and there is often a waiting list.   [facility] [address] [place]  [phone] [email or web address]   [facility] [address] [place], [place] [phone], Ext. [phone] Second and Fourth Thursday of each month, opens at 6:30 AM; Clinic ends at 9 AM.  Patients are seen on a first-come first-served basis, and a limited number are seen during each clinic.   [facility]  [address] Ether Griffins [place], [place] [phone]   Eligibility Requirements You must have lived in [place], [place], or [place] counties for  at least the last three months.   You cannot be eligible for state or federal sponsored [place] Cityhealthcare insurance, including CIG[employer]eterans Administration, [place]icaid, or [employer]icare.   You generally cannot be eligible for healthcare insurance through your employer.    How to apply: Eligibility screenings are held every Tuesday and Wednesday afternoon from 1:00 pm until 4:00 pm. You do not need an appointment for the interview!  [facility] [address], [place], [place] [phone]   [facility]  [phone]   [facility]  [phone]   [facility]  [phone]    Behavioral Health Resources in the Community: Intensive Outpatient Programs Organization         Address  Phone  Notes  [facility] [address], [place], [place] [phone]   [facility] [address], [place], [place] [phone]   [facility] [address], [place], [place]  [phone]   [facility] [address],  [place], [place] [phone] or [phone]   Substance Abuse Resources Organization         Address  Phone  Notes  [facility]  [phone]   [facility]  [phone]   [facility]  [phone]   [facility]  [phone]   Residential & Outpatient Substance Abuse Program  [phone]    Psychological Services Organization         Address  Phone  Notes  [facility]  [phone]- [phone]   [facility]  [phone]- [phone]   [facility] [address], [place] [phone] or [phone]    Mobile Crisis Teams Organization         Address  Phone  Notes  [facility], Mobile Crisis Care Unit  [phone]   [facility]  [address] [place], [place] [phone]   Doristine LocksSharon DeEsch [address] [place] [phone]    Self-Help/Support Groups Organization         Address  Phone             Notes  [facility] [place] - variety of support groups  [phone]- [phone] Call for more information  Narcotics Anonymous (NA), [facility] [address], [place]  2 meetings at this location   Statisticianesidential Treatment Programs Organization         Address  Phone  Notes  [facility] [address],    [place]  [phone]   [facility]  [address], [place] [phone], [place], [place] [phone]   [facility] [address], [place] [phone] Admissions: 8am-3pm M-F  [facility] [address]    [place], [place] [phone]   [facility] [address], [place], [place] [phone]   [facility] [address]  [place], [place] [phone]   [facility] - Intensive Outpatient [address], [place], [place] [phone]   [facility] ([facility] [address]  [place], [place] [phone] or [phone]   [facility] ([facility]) [address] [place], [place] [phone] Accepts Medicaid  [facility] [address]  [place] [phone] Substance Abuse/Addiction Treatment   [facility] Resources Organization         Address  Phone  Notes  [facility]  [phone]  [phone]   °Julie Brannon, PhD [address] [place], [place]   ([phone]) [phone] or (336) [phone]   °[facility]   [address] °[place], [place] ([phone]) [phone]   °[facility] [address], [place], [place] ([phone]) [phone] Insurance/Medicaid/sponsorship through [facility]  °[facility] [address]                                    [place], [place] [phone]) [phone] Therapy/tele-psych/case  °[facility] [address]  ° [place], [place] [phone]    °Dr. Arfeen  ([phone]   °[facility] [place]  [employer] [place] [facility] 1) [address], [place] °2) [address], [place] °3)  [address], [place] ([phone]) [phone] °(336) [phone] ° °(336) [phone]   °[place] Child Abuse Hotline (336) 342-1394 or (336) 342-3537 (After Hours)    ° ° ° °

## 2013-03-11 NOTE — ED Notes (Signed)
PT from home c/o right knee pain. She was in the shower and turned and saw her knee "pop out and pop back in ". She is unable to bear weight on leg.

## 2013-03-11 NOTE — ED Provider Notes (Signed)
CSN: [id number]     Arrival date & time 03/11/13  1914 History  This chart was scribed for non-physician practitioner, Raymon Mutton, PA-C working with Merrie Roof, MD by Greggory Stallion, ED scribe. This patient was seen in room WTR8/WTR8 and the patient's care was started at 8:00 PM.   Chief Complaint  Patient presents with  . Knee Pain   The history is provided by the patient. No language interpreter was used.   HPI Comments: Sue Cooper is a 28 y.o. female who presents to the Emergency Department complaining of right knee injury that occurred earlier today. Pt was in the shower, turned and felt her knee pop out and then back in. Denies fall, hitting her head or LOC. She has sudden onset, cramping right knee pain. Bearing weight worsens the pain. Denies knee swelling, color change, numbness, tingling. Pt states she did the same about 2 years ago but denies past surgery on that knee.   Past Medical History  Diagnosis Date  . Diabetes mellitus   . Gestational diabetes 08/29/2010   Past Surgical History  Procedure Laterality Date  . Foot surgery  2007    pin right foot   Family History  Problem Relation Age of Onset  . Diabetes Mother   . Diabetes Father   . Hypertension Maternal Grandmother    History  Substance Use Topics  . Smoking status: Never Smoker   . Smokeless tobacco: Never Used  . Alcohol Use: No   OB History   Grav Para Term Preterm Abortions TAB SAB Ect Mult Living   3 2 2  0 1 1    2      Review of Systems  Musculoskeletal: Positive for arthralgias. Negative for joint swelling.  Skin: Negative for color change.  Neurological: Negative for numbness.  All other systems reviewed and are negative.   Allergies  Review of patient's allergies indicates no known allergies.  Home Medications   Current Outpatient Rx  Name  Route  Sig  Dispense  Refill  . insulin NPH-regular (NOVOLIN 70/30) (70-30) 100 UNIT/ML injection   Subcutaneous   Inject 25  Units into the skin 2 (two) times daily with a meal.   10 mL   1   . LORazepam (ATIVAN) 1 MG tablet   Oral   Take 1 mg by mouth daily as needed for anxiety. anxiety         . metFORMIN (GLUCOPHAGE-XR) 500 MG 24 hr tablet   Oral   Take 500 mg by mouth at bedtime.         . Multiple Vitamins-Minerals (AIRBORNE PO)   Oral   Take 1 tablet by mouth daily.          BP 135/83  Pulse 87  Temp(Src) 98.7 F (37.1 C)  Resp 18  SpO2 99%  LMP 02/13/2013  Physical Exam  Nursing note and vitals reviewed. Constitutional: She is oriented to person, place, and time. She appears well-developed and well-nourished. No distress.  HENT:  Head: Normocephalic and atraumatic.  Eyes: Conjunctivae and EOM are normal. Right eye exhibits no discharge. Left eye exhibits no discharge.  Neck: Normal range of motion. Neck supple.  Cardiovascular: Normal rate, regular rhythm and normal heart sounds.   Pulses:      Radial pulses are 2+ on the right side, and 2+ on the left side.       Posterior tibial pulses are 2+ on the right side, and 2+ on the left side.  Pulmonary/Chest: Effort normal and breath sounds normal. No respiratory distress. She has no wheezes. She has no rales.  Abdominal:  Obese   Musculoskeletal: Normal range of motion. She exhibits tenderness.       Legs: Negative swelling, erythema, inflammation, ecchymosis, lesions, sores, deformities noted to the right knee. Discomfort upon palpation to the medial aspect of the right knee. Negative pain upon palpation to the posterior aspect of the right knee. Full flexion extension noted. Full range of motion to the right ankle and digits of the right foot. Positive McMurray's. Positive valgus tension. Negative varus tension. Negative laxity. Stable right knee joint.  Neurological: She is alert and oriented to person, place, and time. She exhibits normal muscle tone. Coordination normal.  Strength 5+/5+ to extremities bilaterally with resistance  applied, equal distribution noted Strength intact to digits of the right foot and left foot-equal strength Sensation intact with differentiation sharp and dull touch  Skin: Skin is warm and dry.  Psychiatric: She has a normal mood and affect. Her behavior is normal.    ED Course  Procedures (including critical care time)  DIAGNOSTIC STUDIES: Oxygen Saturation is 99% on RA, normal by my interpretation.    COORDINATION OF CARE: 8:04 PM-Discussed treatment plan which includes xray with pt at bedside and pt agreed to plan.   Dg Knee Complete 4 Views Right  03/11/2013   CLINICAL DATA:  Injury  EXAM: RIGHT KNEE - COMPLETE 4+ VIEW  COMPARISON:  None.  FINDINGS: There is no evidence of fracture, dislocation, or joint effusion. There is no evidence of arthropathy or other focal bone abnormality. Soft tissues are unremarkable.  IMPRESSION: Negative.   Electronically Signed   By: Salome HolmesHector  Cooper M.D.   On: 03/11/2013 20:05   Labs Review Labs Reviewed - No data to display Imaging Review Dg Knee Complete 4 Views Right  03/11/2013   CLINICAL DATA:  Injury  EXAM: RIGHT KNEE - COMPLETE 4+ VIEW  COMPARISON:  None.  FINDINGS: There is no evidence of fracture, dislocation, or joint effusion. There is no evidence of arthropathy or other focal bone abnormality. Soft tissues are unremarkable.  IMPRESSION: Negative.   Electronically Signed   By: Salome HolmesHector  Cooper M.D.   On: 03/11/2013 20:05    EKG Interpretation   None       MDM   Final diagnoses:  Right knee sprain   Filed Vitals:   03/11/13 1916  BP: 135/83  Pulse: 87  Temp: 98.7 F (37.1 C)  Resp: 18  SpO2: 99%   I personally performed the services described in this documentation, which was scribed in my presence. The recorded information has been reviewed and is accurate.  Patient presenting to emergency department with right knee pain that occurred shortly prior to arrival to the ED. Patient reported that while she was in the shower she  twisted her right knee and felt/heard a "pop." Reported that she has been feeling pain in her right knee described as a throbbing sensation without radiation that worsens when applying pressure to the leg. Stated that she had a similar episode approximately 2 years ago. Denied previous injury, numbness, tingling, loss of sensation, swelling, changes to color/appearance.  Alert and oriented. GCS 15. Heart rate and rhythm normal. Lungs clear to auscultation. Radial and DP 2+ bilaterally. Cap refill less than 3 seconds. Negative swelling, deformities, erythema, inflammation, ecchymosis noted to the right knee. Discomfort upon palpation to the medial aspect of the right knee. Positive valgus tension. Negative barriers tension.  Positive McMurray's. Negative laxity. Stable right knee joint. Strength intact to the digits of the feet bilaterally with equal strength. Strength intact to lower extremities with equal distribution with resistance applied. Sensation intact with differentiation sharp and dull touch. Negative calf tenderness. Plain film of right knee negative for acute osseous injury. Doubt fracture. Doubt dislocation. Doubt patellar tendon rupture. Suspicion to be knee sprain. Patient stable, afebrile. Patient neurovascularly intact. Discharged patient. Recommended patient to take ibuprofen as needed for discomfort. Referred patient to primary care provider and orthopedics. Discussed with patient to rest, ice, elevate. Patient placed in the sleeve and crutches administered for comfort. Discussed with patient to closely monitor symptoms and if symptoms are to worsen or change to report back to the ED - strict return instructions given.  Patient agreed to plan of care, understood, all questions answered.   Raymon Mutton, PA-C 03/12/13 1225

## 2013-03-13 NOTE — ED Provider Notes (Signed)
Medical screening examination/treatment/procedure(s) were performed by non-physician practitioner and as supervising physician I was immediately available for consultation/collaboration.   Danyelle Brookover David Geneveive Furness III, MD 03/13/13 1400 

## 2013-04-18 ENCOUNTER — Ambulatory Visit: Payer: 59 | Admitting: *Deleted

## 2013-06-13 ENCOUNTER — Ambulatory Visit: Payer: 59 | Admitting: *Deleted

## 2013-07-13 ENCOUNTER — Ambulatory Visit: Payer: 59 | Admitting: Dietician

## 2013-11-26 ENCOUNTER — Encounter (HOSPITAL_COMMUNITY): Payer: Self-pay | Admitting: Emergency Medicine

## 2014-05-21 ENCOUNTER — Other Ambulatory Visit: Payer: Self-pay | Admitting: Family Medicine

## 2014-05-21 ENCOUNTER — Other Ambulatory Visit (HOSPITAL_COMMUNITY)
Admission: RE | Admit: 2014-05-21 | Discharge: 2014-05-21 | Disposition: A | Discharge status: Home / Self Care | Payer: Managed Care, Other (non HMO) | Source: Ambulatory Visit | Attending: Family Medicine | Admitting: Family Medicine

## 2014-05-21 DIAGNOSIS — Z113 Encounter for screening for infections with a predominantly sexual mode of transmission: Secondary | ICD-10-CM | POA: Diagnosis present

## 2014-05-21 DIAGNOSIS — Z01419 Encounter for gynecological examination (general) (routine) without abnormal findings: Secondary | ICD-10-CM | POA: Diagnosis present

## 2014-05-22 LAB — CYTOLOGY - PAP

## 2014-08-23 ENCOUNTER — Encounter (HOSPITAL_COMMUNITY): Payer: Self-pay | Admitting: Emergency Medicine

## 2014-08-23 ENCOUNTER — Emergency Department (HOSPITAL_COMMUNITY)
Admission: EM | Admit: 2014-08-23 | Discharge: 2014-08-23 | Disposition: A | Discharge status: Home / Self Care | Payer: Managed Care, Other (non HMO) | Attending: Emergency Medicine | Admitting: Emergency Medicine

## 2014-08-23 DIAGNOSIS — N390 Urinary tract infection, site not specified: Secondary | ICD-10-CM | POA: Diagnosis not present

## 2014-08-23 DIAGNOSIS — E1165 Type 2 diabetes mellitus with hyperglycemia: Secondary | ICD-10-CM | POA: Insufficient documentation

## 2014-08-23 DIAGNOSIS — R000 Tachycardia, unspecified: Secondary | ICD-10-CM | POA: Insufficient documentation

## 2014-08-23 DIAGNOSIS — Z79899 Other long term (current) drug therapy: Secondary | ICD-10-CM | POA: Insufficient documentation

## 2014-08-23 DIAGNOSIS — R739 Hyperglycemia, unspecified: Secondary | ICD-10-CM

## 2014-08-23 DIAGNOSIS — R51 Headache: Secondary | ICD-10-CM | POA: Diagnosis not present

## 2014-08-23 DIAGNOSIS — Z3202 Encounter for pregnancy test, result negative: Secondary | ICD-10-CM | POA: Insufficient documentation

## 2014-08-23 DIAGNOSIS — R102 Pelvic and perineal pain: Secondary | ICD-10-CM | POA: Diagnosis present

## 2014-08-23 LAB — WET PREP, GENITAL
CLUE CELLS WET PREP: NONE SEEN
TRICH WET PREP: NONE SEEN
YEAST WET PREP: NONE SEEN

## 2014-08-23 LAB — CBC
HEMATOCRIT: 39 % (ref 36.0–46.0)
Hemoglobin: 13.2 g/dL (ref 12.0–15.0)
MCH: 29.3 pg (ref 26.0–34.0)
MCHC: 33.8 g/dL (ref 30.0–36.0)
MCV: 86.5 fL (ref 78.0–100.0)
Platelets: 335 10*3/uL (ref 150–400)
RBC: 4.51 MIL/uL (ref 3.87–5.11)
RDW: 11.9 % (ref 11.5–15.5)
WBC: 11.9 10*3/uL — AB (ref 4.0–10.5)

## 2014-08-23 LAB — COMPREHENSIVE METABOLIC PANEL
ALBUMIN: 3.7 g/dL (ref 3.5–5.0)
ALT: 20 U/L (ref 14–54)
AST: 15 U/L (ref 15–41)
Alkaline Phosphatase: 92 U/L (ref 38–126)
Anion gap: 8 (ref 5–15)
BILIRUBIN TOTAL: 0.8 mg/dL (ref 0.3–1.2)
BUN: 10 mg/dL (ref 6–20)
CALCIUM: 9.5 mg/dL (ref 8.9–10.3)
CHLORIDE: 100 mmol/L — AB (ref 101–111)
CO2: 27 mmol/L (ref 22–32)
Creatinine, Ser: 0.74 mg/dL (ref 0.44–1.00)
GFR calc Af Amer: >60 mL/min (ref >60)
GFR calc non Af Amer: >60 mL/min (ref >60)
GLUCOSE: 346 mg/dL — AB (ref 65–99)
POTASSIUM: 4.2 mmol/L (ref 3.5–5.1)
SODIUM: 135 mmol/L (ref 135–145)
TOTAL PROTEIN: 8 g/dL (ref 6.5–8.1)

## 2014-08-23 LAB — URINALYSIS, ROUTINE W REFLEX MICROSCOPIC
BILIRUBIN URINE: NEGATIVE
Glucose, UA: >1000 mg/dL — AB
Ketones, ur: NEGATIVE mg/dL
NITRITE: NEGATIVE
PH: 6.5 (ref 5.0–8.0)
Protein, ur: NEGATIVE mg/dL
SPECIFIC GRAVITY, URINE: 1.017 (ref 1.005–1.030)
UROBILINOGEN UA: 0.2 mg/dL (ref 0.0–1.0)

## 2014-08-23 LAB — URINE MICROSCOPIC-ADD ON

## 2014-08-23 LAB — GC/CHLAMYDIA PROBE AMP (~~LOC~~) NOT AT ARMC
Chlamydia: NEGATIVE
Neisseria Gonorrhea: NEGATIVE

## 2014-08-23 LAB — POC URINE PREG, ED: PREG TEST UR: NEGATIVE

## 2014-08-23 LAB — LIPASE, BLOOD: LIPASE: 23 U/L (ref 22–51)

## 2014-08-23 LAB — CBG MONITORING, ED: Glucose-Capillary: 283 mg/dL — ABNORMAL HIGH (ref 65–99)

## 2014-08-23 MED ORDER — NITROFURANTOIN MONOHYD MACRO 100 MG PO CAPS: 100.0000 mg | ORAL_CAPSULE | Freq: Two times a day (BID) | ORAL | Status: DC

## 2014-08-23 MED ORDER — IBUPROFEN 200 MG PO TABS
600.0000 mg | ORAL_TABLET | Freq: Once | ORAL | Status: AC
  Administered 2014-08-23: 600 mg via ORAL
  Filled 2014-08-23: qty 3

## 2014-08-23 MED ORDER — SODIUM CHLORIDE 0.9 % IV BOLUS (SEPSIS)
1000.0000 mL | Freq: Once | INTRAVENOUS | Status: AC
  Administered 2014-08-23: 1000 mL via INTRAVENOUS

## 2014-08-23 NOTE — ED Notes (Signed)
Bed: WA06 Expected date:  Expected time:  Means of arrival:  Comments: 

## 2014-08-23 NOTE — ED Notes (Addendum)
Pt from home c/o left sided flank pain and pelvic "pressure" x 2 weeks. She reports "spotting in between periods" and a "funny feeling" with urination. Hx of diabetes. Pt also c/o headache that just started.

## 2014-08-23 NOTE — ED Provider Notes (Signed)
6:00 AM Patient signed out to me by Sharen Hones, NP.  Patient presents today with pelvic pain.  Pelvic exam performed by previous provider.  Labs showing hyperglycemia, but no signs of DKA.  Plan is for the patient to get IVF and to be discharged once blood sugar has improved.    7:10 Am Blood sugar came down to 283.  Feel that the patient is stable for discharge.  Return precautions given.  Santiago Glad, PA-C 08/24/14 1910  Gerhard Munch, MD 08/25/14 [phone]

## 2014-08-23 NOTE — ED Provider Notes (Signed)
CSN: [id number]     Arrival date & time 08/23/14  0243 History   First MD Initiated Contact with Patient 08/23/14 0357     Chief Complaint  Patient presents with  . Pelvic Pain  . Abdominal Pain     (Consider location/radiation/quality/duration/timing/severity/associated sxs/prior Treatment) HPI Comments: Patient states she's had diffuse abdominal pain and pelvic pressure for the past 2 weeks she's had some spotting between periods and a funny feeling with urination states her blood sugars have not elevated she denies any diarrhea or vomiting she does state that she's had a headache this getting worse and has not taken any medication except Tylenol PM for discomfort she states she's been taking her diabetes medicine without fail. Denies any vaginal discharge  Patient is a 29 y.o. female presenting with pelvic pain and abdominal pain. The history is provided by the patient.  Pelvic Pain This is a new problem. The current episode started 1 to 4 weeks ago. The problem occurs constantly. The problem has been gradually worsening. Associated symptoms include abdominal pain, headaches and nausea. Pertinent negatives include no chills, coughing, fever, numbness, urinary symptoms or vomiting. Nothing aggravates the symptoms. She has tried nothing for the symptoms. The treatment provided no relief.  Abdominal Pain Associated symptoms: dysuria and nausea   Associated symptoms: no chills, no cough, no fever, no hematuria, no vaginal discharge and no vomiting     Past Medical History  Diagnosis Date  . Diabetes mellitus   . Gestational diabetes 08/29/2010   Past Surgical History  Procedure Laterality Date  . Foot surgery  2007    pin right foot   Family History  Problem Relation Age of Onset  . Diabetes Mother   . Diabetes Father   . Hypertension Maternal Grandmother    History  Substance Use Topics  . Smoking status: Never Smoker   . Smokeless tobacco: Never Used  . Alcohol Use: No   OB  History    Gravida Para Term Preterm AB TAB SAB Ectopic Multiple Living   0 Review of Systems  Constitutional: Negative for fever and chills.  Respiratory: Negative for cough.   Gastrointestinal: Positive for nausea and abdominal pain. Negative for vomiting.  Genitourinary: Positive for dysuria and pelvic pain. Negative for frequency, hematuria, vaginal discharge, genital sores and vaginal pain.  Neurological: Positive for headaches. Negative for numbness.  All other systems reviewed and are negative.     Allergies  Review of patient's allergies indicates no known allergies.  Home Medications   Prior to Admission medications   Medication Sig Start Date End Date Taking? Authorizing Provider  diphenhydramine-acetaminophen (TYLENOL PM) 25-500 MG TABS Take 1 tablet by mouth at bedtime as needed (sleep).   Yes Historical Provider, MD  HUMULIN 70/30 KWIKPEN (70-30) 100 UNIT/ML PEN Inject 30 Units into the skin 2 (two) times daily. 08/03/14  Yes Historical Provider, MD  LORazepam (ATIVAN) 1 MG tablet Take 1 mg by mouth daily as needed for anxiety. anxiety   Yes Historical Provider, MD  insulin NPH-regular (NOVOLIN 70/30) (70-30) 100 UNIT/ML injection Inject 25 Units into the skin 2 (two) times daily with a meal. Patient not taking: Reported on 08/23/2014 01/08/13   Catarina Hartshorn, MD  metFORMIN (GLUCOPHAGE-XR) 500 MG 24 hr tablet Take 500 mg by mouth at bedtime.    Historical Provider, MD  nitrofurantoin, macrocrystal-monohydrate, (MACROBID) 100 MG capsule Take 1 capsule (100 mg total) by mouth  2 (two) times daily. 08/23/14   Heather Laisure, PA-C   BP 108/56 mmHg  Pulse 97  Temp(Src) 97.6 F (36.4 C) (Oral)  Resp 12  SpO2 98% Physical Exam  Constitutional: She is oriented to person, place, and time. She appears well-developed and well-nourished.  HENT:  Head: Normocephalic.  Neck: Normal range of motion.  Cardiovascular: Regular rhythm.  Tachycardia present.    Pulmonary/Chest: Effort normal and breath sounds normal.  Abdominal: Soft. Bowel sounds are normal. She exhibits no distension. There is tenderness in the right upper quadrant, epigastric area, left upper quadrant and left lower quadrant.  Musculoskeletal: Normal range of motion. She exhibits no edema or tenderness.  Neurological: She is alert and oriented to person, place, and time.  Skin: Skin is warm and dry.  Nursing note and vitals reviewed.   ED Course  Procedures (including critical care time) Labs Review Labs Reviewed  WET PREP, GENITAL - Abnormal; Notable for the following:    WBC, Wet Prep HPF POC FEW (*)    All other components within normal limits  COMPREHENSIVE METABOLIC PANEL - Abnormal; Notable for the following:    Chloride 100 (*)    Glucose, Bld 346 (*)    All other components within normal limits  CBC - Abnormal; Notable for the following:    WBC 11.9 (*)    All other components within normal limits  URINALYSIS, ROUTINE W REFLEX MICROSCOPIC (NOT AT [facility]) - Abnormal; Notable for the following:    APPearance TURBID (*)    Glucose, UA >1000 (*)    Hgb urine dipstick SMALL (*)    Leukocytes, UA LARGE (*)    All other components within normal limits  CBG MONITORING, ED - Abnormal; Notable for the following:    Glucose-Capillary 283 (*)    All other components within normal limits  URINE CULTURE  LIPASE, BLOOD  URINE MICROSCOPIC-ADD ON  POC URINE PREG, ED  GC/CHLAMYDIA PROBE AMP ([place]) NOT AT [facility]    Imaging Review No results found.   EKG Interpretation None      MDM   Final diagnoses:  UTI (lower urinary tract infection)  Hyperglycemia         Earley Favor, NP 08/24/14 1956  Devoria Albe, MD 08/24/14 2259

## 2014-08-25 ENCOUNTER — Encounter (HOSPITAL_COMMUNITY): Payer: Self-pay | Admitting: *Deleted

## 2014-08-25 ENCOUNTER — Emergency Department (HOSPITAL_COMMUNITY): Payer: Managed Care, Other (non HMO)

## 2014-08-25 ENCOUNTER — Emergency Department (HOSPITAL_COMMUNITY)
Admission: EM | Admit: 2014-08-25 | Discharge: 2014-08-25 | Disposition: A | Discharge status: Home / Self Care | Payer: Managed Care, Other (non HMO) | Attending: Emergency Medicine | Admitting: Emergency Medicine

## 2014-08-25 DIAGNOSIS — G4489 Other headache syndrome: Secondary | ICD-10-CM | POA: Diagnosis not present

## 2014-08-25 DIAGNOSIS — E119 Type 2 diabetes mellitus without complications: Secondary | ICD-10-CM | POA: Insufficient documentation

## 2014-08-25 DIAGNOSIS — R200 Anesthesia of skin: Secondary | ICD-10-CM | POA: Insufficient documentation

## 2014-08-25 DIAGNOSIS — Z792 Long term (current) use of antibiotics: Secondary | ICD-10-CM | POA: Insufficient documentation

## 2014-08-25 DIAGNOSIS — N12 Tubulo-interstitial nephritis, not specified as acute or chronic: Secondary | ICD-10-CM | POA: Insufficient documentation

## 2014-08-25 DIAGNOSIS — Z87440 Personal history of urinary (tract) infections: Secondary | ICD-10-CM | POA: Diagnosis not present

## 2014-08-25 DIAGNOSIS — R1031 Right lower quadrant pain: Secondary | ICD-10-CM | POA: Diagnosis present

## 2014-08-25 LAB — COMPREHENSIVE METABOLIC PANEL
ALBUMIN: 3 g/dL — AB (ref 3.5–5.0)
ALK PHOS: 156 U/L — AB (ref 38–126)
ALT: 54 U/L (ref 14–54)
AST: 43 U/L — ABNORMAL HIGH (ref 15–41)
Anion gap: 9 (ref 5–15)
BILIRUBIN TOTAL: 0.9 mg/dL (ref 0.3–1.2)
BUN: 8 mg/dL (ref 6–20)
CHLORIDE: 103 mmol/L (ref 101–111)
CO2: 21 mmol/L — ABNORMAL LOW (ref 22–32)
Calcium: 8.8 mg/dL — ABNORMAL LOW (ref 8.9–10.3)
Creatinine, Ser: 0.69 mg/dL (ref 0.44–1.00)
GFR calc non Af Amer: >60 mL/min (ref >60)
Glucose, Bld: 198 mg/dL — ABNORMAL HIGH (ref 65–99)
POTASSIUM: 3.8 mmol/L (ref 3.5–5.1)
Sodium: 133 mmol/L — ABNORMAL LOW (ref 135–145)
TOTAL PROTEIN: 7.7 g/dL (ref 6.5–8.1)

## 2014-08-25 LAB — CBC WITH DIFFERENTIAL/PLATELET
Basophils Absolute: 0 10*3/uL (ref 0.0–0.1)
Basophils Relative: 0 % (ref 0–1)
EOS ABS: 0 10*3/uL (ref 0.0–0.7)
EOS PCT: 0 % (ref 0–5)
HEMATOCRIT: 38.2 % (ref 36.0–46.0)
Hemoglobin: 12.8 g/dL (ref 12.0–15.0)
LYMPHS ABS: 1.5 10*3/uL (ref 0.7–4.0)
LYMPHS PCT: 15 % (ref 12–46)
MCH: 28.9 pg (ref 26.0–34.0)
MCHC: 33.5 g/dL (ref 30.0–36.0)
MCV: 86.2 fL (ref 78.0–100.0)
MONO ABS: 0.8 10*3/uL (ref 0.1–1.0)
MONOS PCT: 7 % (ref 3–12)
Neutro Abs: 7.9 10*3/uL — ABNORMAL HIGH (ref 1.7–7.7)
Neutrophils Relative %: 78 % — ABNORMAL HIGH (ref 43–77)
Platelets: 215 10*3/uL (ref 150–400)
RBC: 4.43 MIL/uL (ref 3.87–5.11)
RDW: 12.2 % (ref 11.5–15.5)
WBC: 10.2 10*3/uL (ref 4.0–10.5)

## 2014-08-25 LAB — URINE CULTURE: Special Requests: NORMAL

## 2014-08-25 MED ORDER — SODIUM CHLORIDE 0.9 % IV BOLUS (SEPSIS)
1000.0000 mL | Freq: Once | INTRAVENOUS | Status: AC
  Administered 2014-08-25: 1000 mL via INTRAVENOUS

## 2014-08-25 MED ORDER — HYDROCODONE-ACETAMINOPHEN 5-325 MG PO TABS: 1.0000 | ORAL_TABLET | ORAL | Status: DC | PRN

## 2014-08-25 MED ORDER — SODIUM CHLORIDE 0.9 % IV SOLN
INTRAVENOUS | Status: DC
  Administered 2014-08-25: 22:00:00 via INTRAVENOUS

## 2014-08-25 MED ORDER — DEXTROSE 5 % IV SOLN
1.0000 g | Freq: Once | INTRAVENOUS | Status: AC
  Administered 2014-08-25: 1 g via INTRAVENOUS
  Filled 2014-08-25: qty 10

## 2014-08-25 MED ORDER — ONDANSETRON HCL 4 MG/2ML IJ SOLN
4.0000 mg | Freq: Once | INTRAMUSCULAR | Status: AC
  Administered 2014-08-25: 4 mg via INTRAVENOUS
  Filled 2014-08-25: qty 2

## 2014-08-25 MED ORDER — HYDROMORPHONE HCL 1 MG/ML IJ SOLN
1.0000 mg | Freq: Once | INTRAMUSCULAR | Status: AC
  Administered 2014-08-25: 1 mg via INTRAVENOUS
  Filled 2014-08-25: qty 1

## 2014-08-25 MED ORDER — CEPHALEXIN 500 MG PO CAPS: 500.0000 mg | ORAL_CAPSULE | Freq: Four times a day (QID) | ORAL | Status: DC

## 2014-08-25 MED ORDER — IOHEXOL 300 MG/ML  SOLN
100.0000 mL | Freq: Once | INTRAMUSCULAR | Status: AC | PRN
  Administered 2014-08-25: 100 mL via INTRAVENOUS

## 2014-08-25 NOTE — Discharge Instructions (Signed)
Pyelonephritis, Adult Pyelonephritis is a kidney infection. In general, there are 2 main types of pyelonephritis:  Infections that come on quickly without any warning (acute pyelonephritis).  Infections that persist for a long period of time (chronic pyelonephritis). CAUSES  Two main causes of pyelonephritis are:  Bacteria traveling from the bladder to the kidney. This is a problem especially in pregnant women. The urine in the bladder can become filled with bacteria from multiple causes, including:  Inflammation of the prostate gland (prostatitis).  Sexual intercourse in females.  Bladder infection (cystitis).  Bacteria traveling from the bloodstream to the tissue part of the kidney. Problems that may increase your risk of getting a kidney infection include:  Diabetes.  Kidney stones or bladder stones.  Cancer.  Catheters placed in the bladder.  Other abnormalities of the kidney or ureter. SYMPTOMS   Abdominal pain.  Pain in the side or flank area.  Fever.  Chills.  Upset stomach.  Blood in the urine (dark urine).  Frequent urination.  Strong or persistent urge to urinate.  Burning or stinging when urinating. DIAGNOSIS  Your caregiver may diagnose your kidney infection based on your symptoms. A urine sample may also be taken. TREATMENT  In general, treatment depends on how severe the infection is.   If the infection is mild and caught early, your caregiver may treat you with oral antibiotics and send you home.  If the infection is more severe, the bacteria may have gotten into the bloodstream. This will require intravenous (IV) antibiotics and a hospital stay. Symptoms may include:  High fever.  Severe flank pain.  Shaking chills.  Even after a hospital stay, your caregiver may require you to be on oral antibiotics for a period of time.  Other treatments may be required depending upon the cause of the infection. HOME CARE INSTRUCTIONS   Take your  antibiotics as directed. Finish them even if you start to feel better.  Make an appointment to have your urine checked to make sure the infection is gone.  Drink enough fluids to keep your urine clear or pale yellow.  Take medicines for the bladder if you have urgency and frequency of urination as directed by your caregiver. SEEK IMMEDIATE MEDICAL CARE IF:   You have a fever or persistent symptoms for more than 2-3 days.  You have a fever and your symptoms suddenly get worse.  You are unable to take your antibiotics or fluids.  You develop shaking chills.  You experience extreme weakness or fainting.  There is no improvement after 2 days of treatment. MAKE SURE YOU:  Understand these instructions.  Will watch your condition.  Will get help right away if you are not doing well or get worse. Document Released: 01/11/2005 Document Revised: 07/13/2011 Document Reviewed: 06/17/2010 [facility] Patient Information 2015 [place], [place]. This information is not intended to replace advice given to you by your health care provider. Make sure you discuss any questions you have with your health care provider.  General Headache Without Cause A headache is pain or discomfort felt around the head or neck area. The specific cause of a headache may not be found. There are many causes and types of headaches. A few common ones are:  Tension headaches.  Migraine headaches.  Cluster headaches.  Chronic daily headaches. HOME CARE INSTRUCTIONS   Keep all follow-up appointments with your caregiver or any specialist referral.  Only take over-the-counter or prescription medicines for pain or discomfort as directed by your caregiver.  Lorenz Coaster  down in a dark, quiet room when you have a headache.  Keep a headache journal to find out what may trigger your migraine headaches. For example, write down:  What you eat and drink.  How much sleep you get.  Any change to your diet or medicines.  Try  massage or other relaxation techniques.  Put ice packs or heat on the head and neck. Use these 3 to 4 times per day for 15 to 20 minutes each time, or as needed.  Limit stress.  Sit up straight, and do not tense your muscles.  Quit smoking if you smoke.  Limit alcohol use.  Decrease the amount of caffeine you drink, or stop drinking caffeine.  Eat and sleep on a regular schedule.  Get 7 to 9 hours of sleep, or as recommended by your caregiver.  Keep lights dim if bright lights bother you and make your headaches worse. SEEK MEDICAL CARE IF:   You have problems with the medicines you were prescribed.  Your medicines are not working.  You have a change from the usual headache.  You have nausea or vomiting. SEEK IMMEDIATE MEDICAL CARE IF:   Your headache becomes severe.  You have a fever.  You have a stiff neck.  You have loss of vision.  You have muscular weakness or loss of muscle control.  You start losing your balance or have trouble walking.  You feel faint or pass out.  You have severe symptoms that are different from your first symptoms. MAKE SURE YOU:   Understand these instructions.  Will watch your condition.  Will get help right away if you are not doing well or get worse. Document Released: 01/11/2005 Document Revised: 04/05/2011 Document Reviewed: 01/27/2011 [facility] Patient Information 2015 [place], [place]. This information is not intended to replace advice given to you by your health care provider. Make sure you discuss any questions you have with your health care provider.

## 2014-08-25 NOTE — ED Notes (Addendum)
Pt complains of 8/10 headache and numbness in her fingers since yesterday. Pt also complains of right sided abdominal pain for 1 week. Pt states she has felt some nausea, denies vomiting diarrhea. Pt was diagnosed with UTI on Friday, pt states she has been taking the antibiotic prescribed.

## 2014-08-25 NOTE — ED Notes (Signed)
Pt alert, oriented, and ambulatory upon DC she was advised to follow up with PCP in week and take antibiotics prescribed.

## 2014-08-25 NOTE — ED Notes (Signed)
MD Wentz at bedside.  

## 2014-08-25 NOTE — ED Provider Notes (Signed)
CSN: [id number]     Arrival date & time 08/25/14  1717 History   First MD Initiated Contact with Patient 08/25/14 1926     Chief Complaint  Patient presents with  . Headache  . Numbness  . Abdominal Pain     (Consider location/radiation/quality/duration/timing/severity/associated sxs/prior Treatment) HPI   Sue Cooper is a 29 y.o. female who presents for evaluation of headache for 2 days, and ongoing right lower quadrant abdominal pain for 1 week. She was evaluated here 2 days ago and diagnosed with UTI. Urine culture was done and shows Escherichia coli sensitive to Macrodantin. She denies nausea, vomiting, diarrhea or constipation. She has not eaten much in the last 24 hours. She states that she is not hungry. She denies cough, chest pain, sore throat or ear pain. She has had some clear nasal discharge for a couple of days. She is taking her usual medications. She saw her PCP for checkup. 2 days ago after being discharged from the emergency department. There are no other known modifying factors. There are no other known modifying factors.   Past Medical History  Diagnosis Date  . Diabetes mellitus   . Gestational diabetes 08/29/2010   Past Surgical History  Procedure Laterality Date  . Foot surgery  2007    pin right foot   Family History  Problem Relation Age of Onset  . Diabetes Mother   . Diabetes Father   . Hypertension Maternal Grandmother    History  Substance Use Topics  . Smoking status: Never Smoker   . Smokeless tobacco: Never Used  . Alcohol Use: No   OB History    Gravida Para Term Preterm AB TAB SAB Ectopic Multiple Living   0 Review of Systems  All other systems reviewed and are negative.     Allergies  Review of patient's allergies indicates no known allergies.  Home Medications   Prior to Admission medications   Medication Sig Start Date End Date Taking? Authorizing Provider  diphenhydramine-acetaminophen (TYLENOL PM)  25-500 MG TABS Take 1 tablet by mouth at bedtime as needed (sleep).   Yes Historical Provider, MD  HUMULIN 70/30 KWIKPEN (70-30) 100 UNIT/ML PEN Inject 30 Units into the skin 2 (two) times daily. 08/03/14  Yes Historical Provider, MD  ibuprofen (ADVIL,MOTRIN) 200 MG tablet Take 200 mg by mouth every 6 (six) hours as needed for headache, mild pain or moderate pain.   Yes Historical Provider, MD  LORazepam (ATIVAN) 1 MG tablet Take 1 mg by mouth daily as needed for anxiety. anxiety   Yes Historical Provider, MD  nitrofurantoin, macrocrystal-monohydrate, (MACROBID) 100 MG capsule Take 1 capsule (100 mg total) by mouth 2 (two) times daily. 08/23/14  Yes Heather Laisure, PA-C  cephALEXin (KEFLEX) 500 MG capsule Take 1 capsule (500 mg total) by mouth 4 (four) times daily. 08/25/14   Mancel Bale, MD  HYDROcodone-acetaminophen (NORCO) 5-325 MG per tablet Take 1 tablet by mouth every 4 (four) hours as needed. 08/25/14   Mancel Bale, MD  insulin NPH-regular (NOVOLIN 70/30) (70-30) 100 UNIT/ML injection Inject 25 Units into the skin 2 (two) times daily with a meal. Patient not taking: Reported on 08/23/2014 01/08/13   Catarina Hartshorn, MD   BP 95/64 mmHg  Pulse 81  Temp(Src) 98.4 F (36.9 C) (Oral)  Resp 16  SpO2 98%  LMP 08/09/2014 Physical Exam  Constitutional: She is oriented to person, place, and time. She appears  well-developed and well-nourished. No distress.  HENT:  Head: Normocephalic and atraumatic.  Right Ear: External ear normal.  Left Ear: External ear normal.  Eyes: Conjunctivae and EOM are normal. Pupils are equal, round, and reactive to light.  Neck: Normal range of motion and phonation normal. Neck supple.  Cardiovascular: Normal rate, regular rhythm and normal heart sounds.   Pulmonary/Chest: Effort normal and breath sounds normal. She exhibits no bony tenderness.  Abdominal: Soft. She exhibits no mass. There is tenderness (Right upper lower quadrants, mild). There is no rebound and no  guarding.  Musculoskeletal: Normal range of motion.  Neurological: She is alert and oriented to person, place, and time. No cranial nerve deficit or sensory deficit. She exhibits normal muscle tone. Coordination normal.  Skin: Skin is warm, dry and intact.  Psychiatric: She has a normal mood and affect. Her behavior is normal. Judgment and thought content normal.  Nursing note and vitals reviewed.   ED Course  Procedures (including critical care time)  Medications  0.9 %  sodium chloride infusion ( Intravenous New Bag/Given 08/25/14 2229)  sodium chloride 0.9 % bolus 1,000 mL (0 mLs Intravenous Stopped 08/25/14 2222)  HYDROmorphone (DILAUDID) injection 1 mg (1 mg Intravenous Given 08/25/14 2109)  ondansetron (ZOFRAN) injection 4 mg (4 mg Intravenous Given 08/25/14 2107)  iohexol (OMNIPAQUE) 300 MG/ML solution 100 mL (100 mLs Intravenous Contrast Given 08/25/14 2135)  cefTRIAXone (ROCEPHIN) 1 g in dextrose 5 % 50 mL IVPB (0 g Intravenous Stopped 08/25/14 2327)    Patient Vitals for the past 24 hrs:  BP Temp Temp src Pulse Resp SpO2  08/25/14 2331 95/64 mmHg - - 81 16 98 %  08/25/14 2057 109/79 mmHg - - 94 16 97 %  08/25/14 1725 102/68 mmHg 98.4 F (36.9 C) Oral 115 18 98 %    10:22 PM Reevaluation with update and discussion. After initial assessment and treatment, an updated evaluation reveals she states that her headache is resolved. She still has mild mid lateral abdominal pain. Findings discussed with patient and mother, all questions answered.Mancel Bale L     Labs Review Labs Reviewed  COMPREHENSIVE METABOLIC PANEL - Abnormal; Notable for the following:    Sodium 133 (*)    CO2 21 (*)    Glucose, Bld 198 (*)    Calcium 8.8 (*)    Albumin 3.0 (*)    AST 43 (*)    Alkaline Phosphatase 156 (*)    All other components within normal limits  CBC WITH DIFFERENTIAL/PLATELET - Abnormal; Notable for the following:    Neutrophils Relative % 78 (*)    Neutro Abs 7.9 (*)    All  other components within normal limits    Imaging Review Ct Abdomen Pelvis W Contrast  08/25/2014   CLINICAL DATA:  Right-sided abdominal pain for 1 week.  EXAM: CT ABDOMEN AND PELVIS WITH CONTRAST  TECHNIQUE: Multidetector CT imaging of the abdomen and pelvis was performed using the standard protocol following bolus administration of intravenous contrast.  CONTRAST:  OMNIPAQUE IOHEXOL 300 MG/ML  SOLN  COMPARISON:  None.  FINDINGS: Lower chest:  The included lung bases are clear.  Liver: Prominent in size.  No focal lesion.  Hepatobiliary: Gallbladder physiologically distended. No biliary dilatation.  Pancreas: Normal.  Spleen: Normal.  Small splenules noted posteriorly.  Adrenal glands: No nodule.  Kidneys: Focal pyelonephritis involving the lower right kidney with heterogeneous rounded density in the lower pole measuring 3 cm in adjacent perinephric stranding. Homogeneous left renal  enhancement. No hydronephrosis or ureteral dilatation.  Stomach/Bowel: Stomach is distended with contrast. There are no dilated or thickened small bowel loops. Small volume of stool throughout the colon without colonic wall thickening. The appendix is normal.  Vascular/Lymphatic: No retroperitoneal adenopathy. Abdominal aorta is normal in caliber.  Reproductive: Uterus and adnexa are normal for age.  Bladder: Physiologically distended without wall thickening.  Other: No free air, free fluid, or intra-abdominal fluid collection. Small inguinal lymph nodes without enlarged adenopathy.  Musculoskeletal: There are no acute or suspicious osseous abnormalities. Mild degenerative change about both sacroiliac joints.  IMPRESSION: Focal pyelonephritis involving the lower pole of the right kidney. Probable mild liquefaction but no abscess.   Electronically Signed   By: Rubye Oaks M.D.   On: 08/25/2014 21:57     EKG Interpretation None      MDM   Final diagnoses:  Pyelonephritis  Other headache syndrome     Evaluation consistent with pyelonephritis. Patient is taking Macrodantin, which is not appropriate treatment for pyelonephritis, so we will change her to a cervical spine. No evidence for metabolic instability or impending vascular collapse.  Nursing Notes Reviewed/ Care Coordinated Applicable Imaging Reviewed Interpretation of Laboratory Data incorporated into ED treatment  The patient appears reasonably screened and/or stabilized for discharge and I doubt any other medical condition or other [facility] requiring further screening, evaluation, or treatment in the ED at this time prior to discharge.  Plan: Home Medications- Keflex, Norco; Home Treatments- rest, Fluids; return here if the recommended treatment, does not improve the symptoms; Recommended follow up- PCP 1 week and prn     Mancel Bale, MD 08/25/14 2351

## 2014-08-27 ENCOUNTER — Telehealth: Payer: Self-pay | Admitting: *Deleted

## 2014-08-27 NOTE — ED Notes (Signed)
(+)  urine culture, treated with Nitrofurantoin, OK per J. Frans, Pharm 

## 2015-01-28 DIAGNOSIS — R102 Pelvic and perineal pain: Secondary | ICD-10-CM | POA: Insufficient documentation

## 2015-01-28 DIAGNOSIS — E119 Type 2 diabetes mellitus without complications: Secondary | ICD-10-CM | POA: Insufficient documentation

## 2015-01-28 DIAGNOSIS — Z3202 Encounter for pregnancy test, result negative: Secondary | ICD-10-CM | POA: Insufficient documentation

## 2015-01-28 DIAGNOSIS — Z794 Long term (current) use of insulin: Secondary | ICD-10-CM | POA: Diagnosis not present

## 2015-01-28 DIAGNOSIS — Z79899 Other long term (current) drug therapy: Secondary | ICD-10-CM | POA: Diagnosis not present

## 2015-01-28 DIAGNOSIS — Z792 Long term (current) use of antibiotics: Secondary | ICD-10-CM | POA: Insufficient documentation

## 2015-01-28 DIAGNOSIS — Z202 Contact with and (suspected) exposure to infections with a predominantly sexual mode of transmission: Secondary | ICD-10-CM | POA: Diagnosis not present

## 2015-01-29 ENCOUNTER — Ambulatory Visit (HOSPITAL_BASED_OUTPATIENT_CLINIC_OR_DEPARTMENT_OTHER)
Admission: RE | Admit: 2015-01-29 | Discharge: 2015-01-29 | Disposition: A | Discharge status: Home / Self Care | Payer: Managed Care, Other (non HMO) | Source: Ambulatory Visit | Attending: Emergency Medicine | Admitting: Emergency Medicine

## 2015-01-29 ENCOUNTER — Encounter (HOSPITAL_BASED_OUTPATIENT_CLINIC_OR_DEPARTMENT_OTHER): Payer: Self-pay | Admitting: *Deleted

## 2015-01-29 ENCOUNTER — Emergency Department (HOSPITAL_BASED_OUTPATIENT_CLINIC_OR_DEPARTMENT_OTHER)
Admission: EM | Admit: 2015-01-29 | Discharge: 2015-01-29 | Disposition: A | Discharge status: Home / Self Care | Payer: Managed Care, Other (non HMO) | Attending: Emergency Medicine | Admitting: Emergency Medicine

## 2015-01-29 ENCOUNTER — Other Ambulatory Visit (HOSPITAL_BASED_OUTPATIENT_CLINIC_OR_DEPARTMENT_OTHER): Payer: Self-pay | Admitting: Emergency Medicine

## 2015-01-29 DIAGNOSIS — R52 Pain, unspecified: Secondary | ICD-10-CM

## 2015-01-29 DIAGNOSIS — R102 Pelvic and perineal pain: Secondary | ICD-10-CM

## 2015-01-29 DIAGNOSIS — R252 Cramp and spasm: Secondary | ICD-10-CM

## 2015-01-29 DIAGNOSIS — Z711 Person with feared health complaint in whom no diagnosis is made: Secondary | ICD-10-CM

## 2015-01-29 LAB — URINALYSIS, ROUTINE W REFLEX MICROSCOPIC
Bilirubin Urine: NEGATIVE
Glucose, UA: >1000 mg/dL — AB
Hgb urine dipstick: NEGATIVE
Ketones, ur: NEGATIVE mg/dL
Leukocytes, UA: NEGATIVE
Nitrite: NEGATIVE
PROTEIN: NEGATIVE mg/dL
SPECIFIC GRAVITY, URINE: 1.037 — AB (ref 1.005–1.030)
pH: 6 (ref 5.0–8.0)

## 2015-01-29 LAB — WET PREP, GENITAL
SPERM: NONE SEEN
Trich, Wet Prep: NONE SEEN
Yeast Wet Prep HPF POC: NONE SEEN

## 2015-01-29 LAB — URINE MICROSCOPIC-ADD ON

## 2015-01-29 LAB — GC/CHLAMYDIA PROBE AMP (~~LOC~~) NOT AT ARMC
Chlamydia: NEGATIVE
Neisseria Gonorrhea: NEGATIVE

## 2015-01-29 LAB — PREGNANCY, URINE: PREG TEST UR: NEGATIVE

## 2015-01-29 MED ORDER — AZITHROMYCIN 250 MG PO TABS
1000.0000 mg | ORAL_TABLET | Freq: Once | ORAL | Status: AC
  Administered 2015-01-29: 1000 mg via ORAL
  Filled 2015-01-29: qty 4

## 2015-01-29 MED ORDER — HYDROCODONE-ACETAMINOPHEN 5-325 MG PO TABS
1.0000 | ORAL_TABLET | Freq: Four times a day (QID) | ORAL | Status: DC | PRN
Start: 2015-01-29 — End: 2015-11-22

## 2015-01-29 MED ORDER — HYDROCODONE-ACETAMINOPHEN 5-325 MG PO TABS
1.0000 | ORAL_TABLET | Freq: Once | ORAL | Status: AC
  Administered 2015-01-29: 1 via ORAL
  Filled 2015-01-29: qty 1

## 2015-01-29 MED ORDER — LIDOCAINE HCL (PF) 1 % IJ SOLN
INTRAMUSCULAR | Status: AC
  Administered 2015-01-29: 5 mL
  Filled 2015-01-29: qty 5

## 2015-01-29 MED ORDER — CEFTRIAXONE SODIUM 250 MG IJ SOLR
250.0000 mg | Freq: Once | INTRAMUSCULAR | Status: AC
  Administered 2015-01-29: 250 mg via INTRAMUSCULAR
  Filled 2015-01-29: qty 250

## 2015-01-29 NOTE — ED Notes (Signed)
Pt. Reports pain in pelvis for 2 weeks. Pt. Reports she took Plan B on the 27th of Dec. And has had a cramping feeling since then no bleeding but a spotting on yesterday when she wiped.  Today Pt. Reports she is here because she is having a constant pain that has not gone away.  Most pain is in the lower abd. No vomiting no diarrhea.  No pregnancy test done at home.

## 2015-01-29 NOTE — ED Notes (Signed)
Patient states she took the "Morning after" pill on January 20, 2015

## 2015-01-29 NOTE — ED Provider Notes (Signed)
CSN: [id number]     Arrival date & time 01/28/15  2336 History   First MD Initiated Contact with Patient 01/29/15 [phone]     Chief Complaint  Patient presents with  . Pelvic Pain     (Consider location/radiation/quality/duration/timing/severity/associated sxs/prior Treatment) HPI   This is at 30 yo female who presents with pelvic pain.  Patient reports defuse and lower abdominal cramping, onset after taking the morning after pill on December 26.  No vaginal bleeding or discharge.  Spotting yesterday.  Patient has become more constant and sharp.  Nonlateralizing and current pain 8/10.  Denies associated symptoms including vomiting, diarrhea, fever.  Denies new sexual partners but would like to be tested and treated for STDs.  Past Medical History  Diagnosis Date  . Diabetes mellitus   . Gestational diabetes 08/29/2010   Past Surgical History  Procedure Laterality Date  . Foot surgery  2007    pin right foot   Family History  Problem Relation Age of Onset  . Diabetes Mother   . Diabetes Father   . Hypertension Maternal Grandmother    Social History  Substance Use Topics  . Smoking status: Never Smoker   . Smokeless tobacco: Never Used  . Alcohol Use: No   OB History    Gravida Para Term Preterm AB TAB SAB Ectopic Multiple Living   3 2 2  0 1 1    2      Review of Systems  Constitutional: Negative for fever.  Respiratory: Negative for chest tightness and shortness of breath.   Cardiovascular: Negative for chest pain.  Gastrointestinal: Negative for nausea, vomiting and abdominal pain.  Genitourinary: Positive for pelvic pain. Negative for dysuria, vaginal bleeding and vaginal discharge.  Neurological: Negative for headaches.  All other systems reviewed and are negative.     Allergies  Review of patient's allergies indicates no known allergies.  Home Medications   Prior to Admission medications   Medication Sig Start Date End Date Taking? Authorizing Provider   cephALEXin (KEFLEX) 500 MG capsule Take 1 capsule (500 mg total) by mouth 4 (four) times daily. 08/25/14   Mancel Bale, MD  diphenhydramine-acetaminophen (TYLENOL PM) 25-500 MG TABS Take 1 tablet by mouth at bedtime as needed (sleep).    Historical Provider, MD  HUMULIN 70/30 KWIKPEN (70-30) 100 UNIT/ML PEN Inject 30 Units into the skin 2 (two) times daily. 08/03/14   Historical Provider, MD  HYDROcodone-acetaminophen (NORCO/VICODIN) 5-325 MG tablet Take 1 tablet by mouth every 6 (six) hours as needed for moderate pain. 01/29/15   Shon Baton, MD  ibuprofen (ADVIL,MOTRIN) 200 MG tablet Take 200 mg by mouth every 6 (six) hours as needed for headache, mild pain or moderate pain.    Historical Provider, MD  insulin NPH-regular (NOVOLIN 70/30) (70-30) 100 UNIT/ML injection Inject 25 Units into the skin 2 (two) times daily with a meal. Patient not taking: Reported on 08/23/2014 01/08/13   Catarina Hartshorn, MD  LORazepam (ATIVAN) 1 MG tablet Take 1 mg by mouth daily as needed for anxiety. anxiety    Historical Provider, MD  nitrofurantoin, macrocrystal-monohydrate, (MACROBID) 100 MG capsule Take 1 capsule (100 mg total) by mouth 2 (two) times daily. 08/23/14   Heather Laisure, PA-C   BP 123/67 mmHg  Pulse 84  Temp(Src) 98.3 F (36.8 C) (Oral)  Resp 18  Ht 5\' 3"  (1.6 m)  Wt 220 lb (99.791 kg)  BMI 38.98 kg/m2  SpO2 99% Physical Exam  Constitutional: She is oriented to person, place,  and time. She appears well-developed and well-nourished. No distress.  HENT:  Head: Normocephalic and atraumatic.  Cardiovascular: Normal rate, regular rhythm and normal heart sounds.   Pulmonary/Chest: Effort normal. No respiratory distress. She has no wheezes.  Abdominal: Soft. Bowel sounds are normal. There is no tenderness. There is no rebound and no guarding.  Genitourinary:  Normal external vaginal exam, moderate discharge, no CMT or adnexal tenderness  Neurological: She is alert and oriented to person, place, and  time.  Skin: Skin is warm and dry.  Psychiatric: She has a normal mood and affect.  Nursing note and vitals reviewed.   ED Course  Procedures (including critical care time) Labs Review Labs Reviewed  WET PREP, GENITAL - Abnormal; Notable for the following:    Clue Cells Wet Prep HPF POC PRESENT (*)    WBC, Wet Prep HPF POC MANY (*)    All other components within normal limits  URINALYSIS, ROUTINE W REFLEX MICROSCOPIC (NOT AT [facility]) - Abnormal; Notable for the following:    Specific Gravity, Urine 1.037 (*)    Glucose, UA >1000 (*)    All other components within normal limits  URINE MICROSCOPIC-ADD ON - Abnormal; Notable for the following:    Squamous Epithelial / LPF 0-5 (*)    Bacteria, UA RARE (*)    All other components within normal limits  PREGNANCY, URINE  RPR  HIV ANTIBODY (ROUTINE TESTING)  GC/CHLAMYDIA PROBE AMP ([place]) NOT AT [facility]    Imaging Review No results found. I have personally reviewed and evaluated these images and lab results as part of my medical decision-making.   EKG Interpretation None      MDM   Final diagnoses:  Pelvic pain in female  Concern about STD in female without diagnosis    Patient presents with pelvic pain.  NOntoxic and exam benign.  NO evidence of UTI.  EMpirically treated for STDs and testing pending. On recheck remains nontender.  Will bring back for pelvic US and [facility].  After history, exam, and medical workup I feel the patient has been appropriately medically screened and is safe for discharge home. Pertinent diagnoses were discussed with the patient. Patient was given return precautions.    Shon Batonourtney F Horton, MD 01/30/15 [phone]

## 2015-01-29 NOTE — ED Provider Notes (Signed)
Please see previous provider's note regarding patient's initial presenting history and physical, initial ED course, and associated MDM. The patient evaluated for low abdominal cramping since taking Plan B 2 weeks ago. Was  discharged with order to return for pelvic ultrasound when available. Pelvic ultrasound is performed and shows no acute intrapelvic processes. do not suspect serious or toxic cause of her symptoms here today. Results were reviewed with the patient, and all questions were answered. Strict return and follow-up instructions are reviewed. She expressed understanding of all discharge instructions and felt comfortable to plan of care.  Sue Guiseana Duo Karan Ramnauth, MD 01/30/15 [phone]

## 2015-01-29 NOTE — Discharge Instructions (Signed)
You were seen today for pelvic pain. This may be related to your recent use of Plan B. However,  An ultrasound has been ordered for later this morning. You were tested and treated for STDs.  Pelvic Pain, Female Female pelvic pain can be caused by many different things and start from a variety of places. Pelvic pain refers to pain that is located in the lower half of the abdomen and between your hips. The pain may occur over a short period of time (acute) or may be reoccurring (chronic). The cause of pelvic pain may be related to disorders affecting the female reproductive organs (gynecologic), but it may also be related to the bladder, kidney stones, an intestinal complication, or muscle or skeletal problems. Getting help right away for pelvic pain is important, especially if there has been severe, sharp, or a sudden onset of unusual pain. It is also important to get help right away because some types of pelvic pain can be life threatening.  CAUSES  Below are only some of the causes of pelvic pain. The causes of pelvic pain can be in one of several categories.   Gynecologic.  Pelvic inflammatory disease.  Sexually transmitted infection.  Ovarian cyst or a twisted ovarian ligament (ovarian torsion).  Uterine lining that grows outside the uterus (endometriosis).  Fibroids, cysts, or tumors.  Ovulation.  Pregnancy.  Pregnancy that occurs outside the uterus (ectopic pregnancy).  Miscarriage.  Labor.  Abruption of the placenta or ruptured uterus.  Infection.  Uterine infection (endometritis).  Bladder infection.  Diverticulitis.  Miscarriage related to a uterine infection (septic abortion).  Bladder.  Inflammation of the bladder (cystitis).  Kidney stone(s).  Gastrointestinal.  Constipation.  Diverticulitis.  Neurologic.  Trauma.  Feeling pelvic pain because of mental or emotional causes (psychosomatic).  Cancers of the bowel or pelvis. EVALUATION  Your  caregiver will want to take a careful history of your concerns. This includes recent changes in your health, a careful gynecologic history of your periods (menses), and a sexual history. Obtaining your family history and medical history is also important. Your caregiver may suggest a pelvic exam. A pelvic exam will help identify the location and severity of the pain. It also helps in the evaluation of which organ system may be involved. In order to identify the cause of the pelvic pain and be properly treated, your caregiver may order tests. These tests may include:   A pregnancy test.  Pelvic ultrasonography.  An X-ray exam of the abdomen.  A urinalysis or evaluation of vaginal discharge.  Blood tests. HOME CARE INSTRUCTIONS   Only take over-the-counter or prescription medicines for pain, discomfort, or fever as directed by your caregiver.   Rest as directed by your caregiver.   Eat a balanced diet.   Drink enough fluids to make your urine clear or pale yellow, or as directed.   Avoid sexual intercourse if it causes pain.   Apply warm or cold compresses to the lower abdomen depending on which one helps the pain.   Avoid stressful situations.   Keep a journal of your pelvic pain. Write down when it started, where the pain is located, and if there are things that seem to be associated with the pain, such as food or your menstrual cycle.  Follow up with your caregiver as directed.  SEEK MEDICAL CARE IF:  Your medicine does not help your pain.  You have abnormal vaginal discharge. SEEK IMMEDIATE MEDICAL CARE IF:   You have heavy bleeding from  the vagina.   Your pelvic pain increases.   You feel light-headed or faint.   You have chills.   You have pain with urination or blood in your urine.   You have uncontrolled diarrhea or vomiting.   You have a fever or persistent symptoms for more than 3 days.  You have a fever and your symptoms suddenly get worse.    You are being physically or sexually abused.   This information is not intended to replace advice given to you by your health care provider. Make sure you discuss any questions you have with your health care provider.   Document Released: 12/09/2003 Document Revised: 10/02/2014 Document Reviewed: 05/03/2011 Elsevier Interactive Patient Education [employer]

## 2015-01-30 LAB — RPR: RPR Ser Ql: NONREACTIVE

## 2015-01-30 LAB — HIV ANTIBODY (ROUTINE TESTING W REFLEX): HIV SCREEN 4TH GENERATION: NONREACTIVE

## 2015-03-06 DIAGNOSIS — N3000 Acute cystitis without hematuria: Secondary | ICD-10-CM | POA: Diagnosis not present

## 2015-05-26 DIAGNOSIS — E1165 Type 2 diabetes mellitus with hyperglycemia: Secondary | ICD-10-CM | POA: Diagnosis not present

## 2015-05-26 DIAGNOSIS — E119 Type 2 diabetes mellitus without complications: Secondary | ICD-10-CM | POA: Diagnosis not present

## 2015-05-26 DIAGNOSIS — E669 Obesity, unspecified: Secondary | ICD-10-CM | POA: Diagnosis not present

## 2015-05-30 DIAGNOSIS — M549 Dorsalgia, unspecified: Secondary | ICD-10-CM | POA: Diagnosis not present

## 2015-05-30 DIAGNOSIS — R350 Frequency of micturition: Secondary | ICD-10-CM | POA: Diagnosis not present

## 2015-06-18 ENCOUNTER — Emergency Department (HOSPITAL_COMMUNITY)
Admission: EM | Admit: 2015-06-18 | Discharge: 2015-06-18 | Disposition: A | Discharge status: Home / Self Care | Payer: Managed Care, Other (non HMO) | Attending: Emergency Medicine | Admitting: Emergency Medicine

## 2015-06-18 ENCOUNTER — Encounter (HOSPITAL_COMMUNITY): Payer: Self-pay

## 2015-06-18 DIAGNOSIS — E1165 Type 2 diabetes mellitus with hyperglycemia: Secondary | ICD-10-CM | POA: Insufficient documentation

## 2015-06-18 DIAGNOSIS — Z791 Long term (current) use of non-steroidal anti-inflammatories (NSAID): Secondary | ICD-10-CM | POA: Insufficient documentation

## 2015-06-18 DIAGNOSIS — L304 Erythema intertrigo: Secondary | ICD-10-CM | POA: Diagnosis not present

## 2015-06-18 DIAGNOSIS — Z794 Long term (current) use of insulin: Secondary | ICD-10-CM | POA: Diagnosis not present

## 2015-06-18 DIAGNOSIS — B379 Candidiasis, unspecified: Secondary | ICD-10-CM | POA: Diagnosis not present

## 2015-06-18 DIAGNOSIS — B372 Candidiasis of skin and nail: Secondary | ICD-10-CM | POA: Insufficient documentation

## 2015-06-18 DIAGNOSIS — R739 Hyperglycemia, unspecified: Secondary | ICD-10-CM

## 2015-06-18 LAB — I-STAT BETA HCG BLOOD, ED (MC, WL, AP ONLY): I-stat hCG, quantitative: <5 m[IU]/mL (ref <5)

## 2015-06-18 LAB — CBC WITH DIFFERENTIAL/PLATELET
Basophils Absolute: 0 10*3/uL (ref 0.0–0.1)
Basophils Relative: 0 %
Eosinophils Absolute: 0.1 10*3/uL (ref 0.0–0.7)
Eosinophils Relative: 1 %
HCT: 37.2 % (ref 36.0–46.0)
HEMOGLOBIN: 13 g/dL (ref 12.0–15.0)
Lymphocytes Relative: 34 %
Lymphs Abs: 3.2 10*3/uL (ref 0.7–4.0)
MCH: 29.3 pg (ref 26.0–34.0)
MCHC: 34.9 g/dL (ref 30.0–36.0)
MCV: 83.8 fL (ref 78.0–100.0)
MONOS PCT: 6 %
Monocytes Absolute: 0.5 10*3/uL (ref 0.1–1.0)
NEUTROS PCT: 59 %
Neutro Abs: 5.5 10*3/uL (ref 1.7–7.7)
Platelets: 357 10*3/uL (ref 150–400)
RBC: 4.44 MIL/uL (ref 3.87–5.11)
RDW: 12.1 % (ref 11.5–15.5)
WBC: 9.4 10*3/uL (ref 4.0–10.5)

## 2015-06-18 LAB — I-STAT CHEM 8, ED
BUN: 8 mg/dL (ref 6–20)
Calcium, Ion: 1.17 mmol/L (ref 1.12–1.23)
Chloride: 97 mmol/L — ABNORMAL LOW (ref 101–111)
Creatinine, Ser: 0.7 mg/dL (ref 0.44–1.00)
GLUCOSE: 423 mg/dL — AB (ref 65–99)
HEMATOCRIT: 42 % (ref 36.0–46.0)
HEMOGLOBIN: 14.3 g/dL (ref 12.0–15.0)
POTASSIUM: 3.8 mmol/L (ref 3.5–5.1)
Sodium: 135 mmol/L (ref 135–145)
TCO2: 25 mmol/L (ref 0–100)

## 2015-06-18 LAB — CBG MONITORING, ED
GLUCOSE-CAPILLARY: 418 mg/dL — AB (ref 65–99)
GLUCOSE-CAPILLARY: 381 mg/dL — AB (ref 65–99)

## 2015-06-18 LAB — BASIC METABOLIC PANEL
Anion gap: 9 (ref 5–15)
BUN: 10 mg/dL (ref 6–20)
CO2: 25 mmol/L (ref 22–32)
CREATININE: 0.73 mg/dL (ref 0.44–1.00)
Calcium: 9.5 mg/dL (ref 8.9–10.3)
Chloride: 97 mmol/L — ABNORMAL LOW (ref 101–111)
GFR calc non Af Amer: >60 mL/min (ref >60)
Glucose, Bld: 431 mg/dL — ABNORMAL HIGH (ref 65–99)
Potassium: 3.7 mmol/L (ref 3.5–5.1)
SODIUM: 131 mmol/L — AB (ref 135–145)

## 2015-06-18 MED ORDER — ZINC OXIDE 40 % EX OINT: 1.0000 "application " | TOPICAL_OINTMENT | CUTANEOUS | Status: DC | PRN

## 2015-06-18 MED ORDER — INSULIN ASPART 100 UNIT/ML ~~LOC~~ SOLN
12.0000 [IU] | Freq: Once | SUBCUTANEOUS | Status: AC
  Administered 2015-06-18: 12 [IU] via SUBCUTANEOUS
  Filled 2015-06-18: qty 1

## 2015-06-18 MED ORDER — CLOTRIMAZOLE 1 % EX CREA: TOPICAL_CREAM | CUTANEOUS | Status: DC

## 2015-06-18 NOTE — ED Notes (Signed)
Patient c/o redness and irritation to bilateral groin areas and buttock area x 1 month.

## 2015-06-18 NOTE — ED Notes (Signed)
PT DISCHARGED. INSTRUCTIONS AND PRESCRIPTIONS GIVEN. AAOX4. PT IN NO APPARENT DISTRESS OR PAIN. THE OPPORTUNITY TO ASK QUESTIONS WAS PROVIDED. 

## 2015-06-18 NOTE — ED Provider Notes (Signed)
CSN: [id number]     Arrival date & time 06/18/15  1707 History  By signing my name below, I, Sue Cooper, attest that this documentation has been prepared under the direction and in the presence of Sue Colyn Miron, PA-C. Electronically Signed: Ronney Cooper, ED Scribe. 06/18/2015. 7:28 PM.    Chief Complaint  Patient presents with  . groin irritation    The history is provided by the patient. No language interpreter was used.    HPI Comments: Sue Cooper is a 30 y.o. female who presents to the Emergency Department complaining of gradual-onset, constant, worsening, dry, chafing, irritation between the folds of her groin and buttocks that began 1 month ago. She also complains of associated redness to the skin in that region.. She states she has been applying lotion with mild relief. Patient reports a history of DM, and is currently taking Humulin. She reports she has lately been having fluctuating blood sugars, so she is currently being followed by an endocrinologist. She reports she has an appointment scheduled with her endocrinologist in about 2 weeks. She states she had taken insulin and checked her blood sugar this morning, which was 240. She states she had then eaten and has not checked her blood sugar since then. She denies vaginal bleeding, vaginal discharge, dysuria, urgency, frequency, hematuria, fever, abdominal pain, nausea, or vomiting.    Past Medical History  Diagnosis Date  . Diabetes mellitus   . Gestational diabetes 08/29/2010   Past Surgical History  Procedure Laterality Date  . Foot surgery  2007    pin right foot   Family History  Problem Relation Age of Onset  . Diabetes Mother   . Diabetes Father   . Hypertension Maternal Grandmother    Social History  Substance Use Topics  . Smoking status: Never Smoker   . Smokeless tobacco: Never Used  . Alcohol Use: No   OB History    Gravida Para Term Preterm AB TAB SAB Ectopic Multiple Living   3 2 2  0 1 1    2      Review  of Systems  Constitutional: Negative for fever and chills.  HENT: Negative for congestion and sore throat.   Eyes: Negative for visual disturbance.  Respiratory: Negative for cough and shortness of breath.   Cardiovascular: Negative for chest pain.  Gastrointestinal: Negative for nausea, vomiting, abdominal pain and diarrhea.  Genitourinary: Negative for dysuria, urgency, frequency, hematuria, vaginal bleeding and vaginal discharge.       Positive for groin irritation.  Musculoskeletal: Negative for back pain.  Skin: Positive for rash.  Neurological: Negative for headaches.      Allergies  Review of patient's allergies indicates no known allergies.  Home Medications   Prior to Admission medications   Medication Sig Start Date End Date Taking? Authorizing Provider  cephALEXin (KEFLEX) 500 MG capsule Take 1 capsule (500 mg total) by mouth 4 (four) times daily. 08/25/14   Mancel Bale, MD  clotrimazole (LOTRIMIN) 1 % cream Apply to affected area 2 times daily 06/18/15   Everlene Farrier, PA-C  diphenhydramine-acetaminophen (TYLENOL PM) 25-500 MG TABS Take 1 tablet by mouth at bedtime as needed (sleep).    Historical Provider, MD  HUMULIN 70/30 KWIKPEN (70-30) 100 UNIT/ML PEN Inject 30 Units into the skin 2 (two) times daily. 08/03/14   Historical Provider, MD  HYDROcodone-acetaminophen (NORCO/VICODIN) 5-325 MG tablet Take 1 tablet by mouth every 6 (six) hours as needed for moderate pain. 01/29/15   Shon Baton, MD  ibuprofen (ADVIL,MOTRIN) 200 MG tablet Take 200 mg by mouth every 6 (six) hours as needed for headache, mild pain or moderate pain.    Historical Provider, MD  insulin NPH-regular (NOVOLIN 70/30) (70-30) 100 UNIT/ML injection Inject 25 Units into the skin 2 (two) times daily with a meal. Patient not taking: Reported on 08/23/2014 01/08/13   Catarina Hartshornavid Tat, MD  liver oil-zinc oxide (DESITIN) 40 % ointment Apply 1 application topically as needed for irritation. 06/18/15   Everlene FarrierWilliam Ahliyah Nienow,  PA-C  LORazepam (ATIVAN) 1 MG tablet Take 1 mg by mouth daily as needed for anxiety. anxiety    Historical Provider, MD  nitrofurantoin, macrocrystal-monohydrate, (MACROBID) 100 MG capsule Take 1 capsule (100 mg total) by mouth 2 (two) times daily. 08/23/14   Heather Laisure, PA-C   BP 112/75 mmHg  Pulse 88  Temp(Src) 98.2 F (36.8 C) (Oral)  Resp 16  Ht 5\' 3"  (1.6 m)  Wt 99.791 kg  BMI 38.98 kg/m2  SpO2 100%  LMP 05/13/2015 Physical Exam  Constitutional: She appears well-developed and well-nourished. No distress.  Nontoxic appearing.  HENT:  Head: Normocephalic and atraumatic.  Eyes: Conjunctivae are normal. Pupils are equal, round, and reactive to light. Right eye exhibits no discharge. Left eye exhibits no discharge.  Neck: Neck supple.  Cardiovascular: Normal rate, regular rhythm, normal heart sounds and intact distal pulses.   Pulmonary/Chest: Effort normal and breath sounds normal. No respiratory distress.  Abdominal: Soft. There is no tenderness.  Genitourinary:  GU exam performed by me with female scribe chaperone. Yeast infection noted to inguinal area and groin.   Lymphadenopathy:    She has no cervical adenopathy.  Neurological: She is alert. Coordination normal.  Skin: Skin is warm and dry. No rash noted. She is not diaphoretic. No erythema. No pallor.  Psychiatric: She has a normal mood and affect. Her behavior is normal.  Nursing note and vitals reviewed.   ED Course  Procedures (including critical care time)  DIAGNOSTIC STUDIES: Oxygen Saturation is 100% on RA, normal by my interpretation.    COORDINATION OF CARE: 5:28 PM - Suspect candida infection. Discussed treatment plan with pt at bedside which includes Nystatin and Desitin cream. Sue also check pt's CBG. Pt verbalized understanding and agreed to plan.   Results for orders placed or performed during the hospital encounter of 06/18/15  Basic metabolic panel  Result Value Ref Range   Sodium 131 (L) 135  - 145 mmol/L   Potassium 3.7 3.5 - 5.1 mmol/L   Chloride 97 (L) 101 - 111 mmol/L   CO2 25 22 - 32 mmol/L   Glucose, Bld 431 (H) 65 - 99 mg/dL   BUN 10 6 - 20 mg/dL   Creatinine, Ser 8.290.73 0.44 - 1.00 mg/dL   Calcium 9.5 8.9 - 56.210.3 mg/dL   GFR calc non Af Amer >60 >60 mL/min   GFR calc Af Amer >60 >60 mL/min   Anion gap 9 5 - 15  CBC with Differential  Result Value Ref Range   WBC 9.4 4.0 - 10.5 K/uL   RBC 4.44 3.87 - 5.11 MIL/uL   Hemoglobin 13.0 12.0 - 15.0 g/dL   HCT 13.037.2 86.536.0 - 78.446.0 %   MCV 83.8 78.0 - 100.0 fL   MCH 29.3 26.0 - 34.0 pg   MCHC 34.9 30.0 - 36.0 g/dL   RDW 69.612.1 29.511.5 - 28.415.5 %   Platelets 357 150 - 400 K/uL   Neutrophils Relative % 59 %   Neutro Abs 5.5  1.7 - 7.7 K/uL   Lymphocytes Relative 34 %   Lymphs Abs 3.2 0.7 - 4.0 K/uL   Monocytes Relative 6 %   Monocytes Absolute 0.5 0.1 - 1.0 K/uL   Eosinophils Relative 1 %   Eosinophils Absolute 0.1 0.0 - 0.7 K/uL   Basophils Relative 0 %   Basophils Absolute 0.0 0.0 - 0.1 K/uL  CBG monitoring, ED  Result Value Ref Range   Glucose-Capillary 418 (H) 65 - 99 mg/dL  I-stat Chem 8, ED  Result Value Ref Range   Sodium 135 135 - 145 mmol/L   Potassium 3.8 3.5 - 5.1 mmol/L   Chloride 97 (L) 101 - 111 mmol/L   BUN 8 6 - 20 mg/dL   Creatinine, Ser 1.61 0.44 - 1.00 mg/dL   Glucose, Bld 096 (H) 65 - 99 mg/dL   Calcium, Ion 0.45 4.09 - 1.23 mmol/L   TCO2 25 0 - 100 mmol/L   Hemoglobin 14.3 12.0 - 15.0 g/dL   HCT 81.1 91.4 - 78.2 %  I-Stat Beta hCG blood, ED ([facility], [facility], [facility] only)  Result Value Ref Range   I-stat hCG, quantitative <5.0 <5 mIU/mL   Comment 3          CBG monitoring, ED  Result Value Ref Range   Glucose-Capillary 381 (H) 65 - 99 mg/dL     MDM   Meds given in ED:  Medications  insulin aspart (novoLOG) injection 12 Units (12 Units Subcutaneous Given 06/18/15 1757)    Discharge Medication List as of 06/18/2015  7:41 PM    START taking these medications   Details  clotrimazole (LOTRIMIN) 1 % cream  Apply to affected area 2 times daily, Print    liver oil-zinc oxide (DESITIN) 40 % ointment Apply 1 application topically as needed for irritation., Starting 06/18/2015, Until Discontinued, Print        Final diagnoses:  Candidal intertrigo  Hyperglycemia   This is a 30 y.o. female who presents to the Emergency Department complaining of gradual-onset, constant, worsening, dry, chafing, irritation between the folds of her groin and buttocks that began 1 month ago. She also complains of associated redness to the skin in that region.. She states she has been applying lotion with mild relief. Patient reports a history of DM, and is currently taking Humulin. She reports she has lately been having fluctuating blood sugars, so she is currently being followed by an endocrinologist. She reports she has an appointment scheduled with her endocrinologist in about 2 weeks. On exam the patient is afebrile nontoxic appearing. She has a yeast infection noted to her groin. Fingerstick blood glucose revealed elevated blood sugar. BMP revealed a glucose of 431 with a normal anion gap. CBC is unremarkable. Patient received insulin and was provided with oral hydration. Patient just ate [employer] which included an apple pie, double cheeseburger and SVT. This is likely the cause of her hyperglycemia. She had not taken any insulin. She reports she does have insulin at home. Repeat CBG revealed improving blood sugar. Encouraged her to continue to take her insulin as prescribed and to call her endocrinologist to seek appointment for close follow-up. I discussed diet for her diabetes. I advised that her yeast infection may not improve until her blood sugar is better controlled. We'll discharge with clotrimazole cream and Desitin ointment. I discussed methods on how to treat her yeast infection. I discussed return precautions. I advised the patient to follow-up with their primary care provider this week. I advised the  patient to  return to the emergency department with new or worsening symptoms or new concerns. The patient verbalized understanding and agreement with plan.    This patient was discussed with and evaluated by Dr. Cyndie Chime who agrees with assessment and plan.   I personally performed the services described in this documentation, which was scribed in my presence. The recorded information has been reviewed and is accurate.      Everlene Farrier, PA-C 06/20/15 1610  Leta Baptist, MD 06/22/15 [phone]

## 2015-06-18 NOTE — Discharge Instructions (Signed)
Cutaneous Candidiasis Cutaneous candidiasis is a condition in which there is an overgrowth of yeast (candida) on the skin. Yeast normally live on the skin, but in small enough numbers not to cause any symptoms. In certain cases, increased growth of the yeast may cause an actual yeast infection. This kind of infection usually occurs in areas of the skin that are constantly warm and moist, such as the armpits or the groin. Yeast is the most common cause of diaper rash in babies and in people who cannot control their bowel movements (incontinence). CAUSES  The fungus that most often causes cutaneous candidiasis is Candida albicans. Conditions that can increase the risk of getting a yeast infection of the skin include:  Obesity.  Pregnancy.  Diabetes.  Taking antibiotic medicine.  Taking birth control pills.  Taking steroid medicines.  Thyroid disease.  An iron or zinc deficiency.  Problems with the immune system. SYMPTOMS   Red, swollen area of the skin.  Bumps on the skin.  Itchiness. DIAGNOSIS  The diagnosis of cutaneous candidiasis is usually based on its appearance. Light scrapings of the skin may also be taken and viewed under a microscope to identify the presence of yeast. TREATMENT  Antifungal creams may be applied to the infected skin. In severe cases, oral medicines may be needed.  HOME CARE INSTRUCTIONS   Keep your skin clean and dry.  Maintain a healthy weight.  If you have diabetes, keep your blood sugar under control. SEEK IMMEDIATE MEDICAL CARE IF:  Your rash continues to spread despite treatment.  You have a fever, chills, or abdominal pain.   This information is not intended to replace advice given to you by your health care provider. Make sure you discuss any questions you have with your health care provider.   Document Released: 09/29/2010 Document Revised: 04/05/2011 Document Reviewed: 07/15/2014 Elsevier Interactive Patient Education 2016 Elsevier  Inc. Hyperglycemia Hyperglycemia occurs when the glucose (sugar) in your blood is too high. Hyperglycemia can happen for many reasons, but it most often happens to people who do not know they have diabetes or are not managing their diabetes properly.  CAUSES  Whether you have diabetes or not, there are other causes of hyperglycemia. Hyperglycemia can occur when you have diabetes, but it can also occur in other situations that you might not be as aware of, such as: Diabetes  If you have diabetes and are having problems controlling your blood glucose, hyperglycemia could occur because of some of the following reasons:  Not following your meal plan.  Not taking your diabetes medications or not taking it properly.  Exercising less or doing less activity than you normally do.  Being sick. Pre-diabetes  This cannot be ignored. Before people develop Type 2 diabetes, they almost always have "pre-diabetes." This is when your blood glucose levels are higher than normal, but not yet high enough to be diagnosed as diabetes. Research has shown that some long-term damage to the body, especially the heart and circulatory system, may already be occurring during pre-diabetes. If you take action to manage your blood glucose when you have pre-diabetes, you may delay or prevent Type 2 diabetes from developing. Stress  If you have diabetes, you may be "diet" controlled or on oral medications or insulin to control your diabetes. However, you may find that your blood glucose is higher than usual in the hospital whether you have diabetes or not. This is often referred to as "stress hyperglycemia." Stress can elevate your blood glucose. This happens  because of hormones put out by the body during times of stress. If stress has been the cause of your high blood glucose, it can be followed regularly by your caregiver. That way he/she can make sure your hyperglycemia does not continue to get worse or progress to  diabetes. Steroids  Steroids are medications that act on the infection fighting system (immune system) to block inflammation or infection. One side effect can be a rise in blood glucose. Most people can produce enough extra insulin to allow for this rise, but for those who cannot, steroids make blood glucose levels go even higher. It is not unusual for steroid treatments to "uncover" diabetes that is developing. It is not always possible to determine if the hyperglycemia will go away after the steroids are stopped. A special blood test called an A1c is sometimes done to determine if your blood glucose was elevated before the steroids were started. SYMPTOMS  Thirsty.  Frequent urination.  Dry mouth.  Blurred vision.  Tired or fatigue.  Weakness.  Sleepy.  Tingling in feet or leg. DIAGNOSIS  Diagnosis is made by monitoring blood glucose in one or all of the following ways:  A1c test. This is a chemical found in your blood.  Fingerstick blood glucose monitoring.  Laboratory results. TREATMENT  First, knowing the cause of the hyperglycemia is important before the hyperglycemia can be treated. Treatment may include, but is not be limited to:  Education.  Change or adjustment in medications.  Change or adjustment in meal plan.  Treatment for an illness, infection, etc.  More frequent blood glucose monitoring.  Change in exercise plan.  Decreasing or stopping steroids.  Lifestyle changes. HOME CARE INSTRUCTIONS   Test your blood glucose as directed.  Exercise regularly. Your caregiver will give you instructions about exercise. Pre-diabetes or diabetes which comes on with stress is helped by exercising.  Eat wholesome, balanced meals. Eat often and at regular, fixed times. Your caregiver or nutritionist will give you a meal plan to guide your sugar intake.  Being at an ideal weight is important. If needed, losing as little as 10 to 15 pounds may help improve blood  glucose levels. SEEK MEDICAL CARE IF:   You have questions about medicine, activity, or diet.  You continue to have symptoms (problems such as increased thirst, urination, or weight gain). SEEK IMMEDIATE MEDICAL CARE IF:   You are vomiting or have diarrhea.  Your breath smells fruity.  You are breathing faster or slower.  You are very sleepy or incoherent.  You have numbness, tingling, or pain in your feet or hands.  You have chest pain.  Your symptoms get worse even though you have been following your caregiver's orders.  If you have any other questions or concerns.   This information is not intended to replace advice given to you by your health care provider. Make sure you discuss any questions you have with your health care provider.   Document Released: 07/07/2000 Document Revised: 04/05/2011 Document Reviewed: 09/17/2014 Elsevier Interactive Patient Education [employer]2016 Elsevier Inc.

## 2015-08-06 DIAGNOSIS — N3000 Acute cystitis without hematuria: Secondary | ICD-10-CM | POA: Diagnosis not present

## 2015-10-31 DIAGNOSIS — R69 Illness, unspecified: Secondary | ICD-10-CM | POA: Diagnosis not present

## 2015-10-31 DIAGNOSIS — Z3201 Encounter for pregnancy test, result positive: Secondary | ICD-10-CM | POA: Diagnosis not present

## 2015-10-31 DIAGNOSIS — E1165 Type 2 diabetes mellitus with hyperglycemia: Secondary | ICD-10-CM | POA: Diagnosis not present

## 2015-10-31 DIAGNOSIS — N912 Amenorrhea, unspecified: Secondary | ICD-10-CM | POA: Diagnosis not present

## 2015-11-22 ENCOUNTER — Inpatient Hospital Stay (HOSPITAL_COMMUNITY)
Admission: AD | Admit: 2015-11-22 | Discharge: 2015-11-22 | Disposition: A | Discharge status: Home / Self Care | Payer: Managed Care, Other (non HMO) | Source: Ambulatory Visit | Attending: Obstetrics and Gynecology | Admitting: Obstetrics and Gynecology

## 2015-11-22 ENCOUNTER — Inpatient Hospital Stay (HOSPITAL_COMMUNITY): Payer: Managed Care, Other (non HMO)

## 2015-11-22 ENCOUNTER — Encounter (HOSPITAL_COMMUNITY): Payer: Self-pay | Admitting: *Deleted

## 2015-11-22 DIAGNOSIS — Z3A09 9 weeks gestation of pregnancy: Secondary | ICD-10-CM | POA: Insufficient documentation

## 2015-11-22 DIAGNOSIS — O24111 Pre-existing diabetes mellitus, type 2, in pregnancy, first trimester: Secondary | ICD-10-CM | POA: Diagnosis not present

## 2015-11-22 DIAGNOSIS — E119 Type 2 diabetes mellitus without complications: Secondary | ICD-10-CM | POA: Diagnosis not present

## 2015-11-22 DIAGNOSIS — Z3A01 Less than 8 weeks gestation of pregnancy: Secondary | ICD-10-CM | POA: Diagnosis not present

## 2015-11-22 DIAGNOSIS — R109 Unspecified abdominal pain: Secondary | ICD-10-CM

## 2015-11-22 DIAGNOSIS — O26891 Other specified pregnancy related conditions, first trimester: Secondary | ICD-10-CM

## 2015-11-22 DIAGNOSIS — O208 Other hemorrhage in early pregnancy: Secondary | ICD-10-CM | POA: Diagnosis not present

## 2015-11-22 DIAGNOSIS — Z3A10 10 weeks gestation of pregnancy: Secondary | ICD-10-CM | POA: Diagnosis not present

## 2015-11-22 DIAGNOSIS — O26899 Other specified pregnancy related conditions, unspecified trimester: Secondary | ICD-10-CM

## 2015-11-22 DIAGNOSIS — Z794 Long term (current) use of insulin: Secondary | ICD-10-CM | POA: Insufficient documentation

## 2015-11-22 HISTORY — DX: Anxiety disorder, unspecified: F41.9

## 2015-11-22 HISTORY — DX: Headache, unspecified: R51.9

## 2015-11-22 HISTORY — DX: Headache: R51

## 2015-11-22 LAB — CBC
HCT: 32.8 % — ABNORMAL LOW (ref 36.0–46.0)
HEMOGLOBIN: 11.2 g/dL — AB (ref 12.0–15.0)
MCH: 29.3 pg (ref 26.0–34.0)
MCHC: 34.1 g/dL (ref 30.0–36.0)
MCV: 85.9 fL (ref 78.0–100.0)
Platelets: 333 10*3/uL (ref 150–400)
RBC: 3.82 MIL/uL — AB (ref 3.87–5.11)
RDW: 12.4 % (ref 11.5–15.5)
WBC: 11.6 10*3/uL — AB (ref 4.0–10.5)

## 2015-11-22 LAB — URINE MICROSCOPIC-ADD ON

## 2015-11-22 LAB — HCG, QUANTITATIVE, PREGNANCY: HCG, BETA CHAIN, QUANT, S: 31123 m[IU]/mL — AB (ref <5)

## 2015-11-22 LAB — HIV ANTIBODY (ROUTINE TESTING W REFLEX): HIV SCREEN 4TH GENERATION: NONREACTIVE

## 2015-11-22 LAB — RPR: RPR: NONREACTIVE

## 2015-11-22 LAB — POCT PREGNANCY, URINE: Preg Test, Ur: POSITIVE — AB

## 2015-11-22 MED ORDER — PRENATAL 19 29-1 MG PO TABS: 1.0000 | ORAL_TABLET | Freq: Every day | ORAL | 8 refills | Status: DC

## 2015-11-22 NOTE — Discharge Instructions (Signed)
Continue prenatal care as you have scheduled. Continue to take your medication for diabetes and monitory your blood sugar. Pick up your prenatal vitamins and begin those today. No smoking, no alcohol, no street drugs.  Can use Tylenol if needed for pain.

## 2015-11-22 NOTE — MAU Note (Signed)
[facility]; c/o intermittent cramping and back pain since 2000 yesterday; pt worked third shift last night;

## 2015-11-22 NOTE — MAU Provider Note (Signed)
History     CSN: [id number]  Arrival date and time: 11/22/15 [id number]    Chief Complaint  Patient presents with  . Possible Pregnancy  . Abdominal Pain  . Back Pain   HPI Sue Cooper 30 y.o. [id number]   Comes to MAU withintermittent abdominal cramping and back pain that started several days ago.  She has insulin dependent Type 2 diabetes and is aware that her glucose levels are important in this pregnancy.  Is worried about the pregnancy.  [facility].  OB History    Gravida Para Term Preterm AB Living   4 2 2  0 1 2   SAB TAB Ectopic Multiple Live Births     1     2      Past Medical History:  Diagnosis Date  . Anxiety   . Diabetes mellitus   . Gestational diabetes 08/29/2010  . Headache     Past Surgical History:  Procedure Laterality Date  . FOOT SURGERY  2007   pin right foot    Family History  Problem Relation Age of Onset  . Diabetes Mother   . Diabetes Father   . Hypertension Maternal Grandmother     Social History  Substance Use Topics  . Smoking status: Never Smoker  . Smokeless tobacco: Never Used  . Alcohol use No    Allergies: No Known Allergies  Prescriptions Prior to Admission  Medication Sig Dispense Refill Last Dose  . cephALEXin (KEFLEX) 500 MG capsule Take 1 capsule (500 mg total) by mouth 4 (four) times daily. 28 capsule 0   . clotrimazole (LOTRIMIN) 1 % cream Apply to affected area 2 times daily 60 g 1   . diphenhydramine-acetaminophen (TYLENOL PM) 25-500 MG TABS Take 1 tablet by mouth at bedtime as needed (sleep).   Past Month at Unknown time  . HUMULIN 70/30 KWIKPEN (70-30) 100 UNIT/ML PEN Inject 30 Units into the skin 2 (two) times daily.  0 08/25/2014 at Unknown time  . HYDROcodone-acetaminophen (NORCO/VICODIN) 5-325 MG tablet Take 1 tablet by mouth every 6 (six) hours as needed for moderate pain. 10 tablet 0   . ibuprofen (ADVIL,MOTRIN) 200 MG tablet Take 200 mg by mouth  every 6 (six) hours as needed for headache, mild pain or moderate pain.   08/25/2014 at Unknown time  . insulin NPH-regular (NOVOLIN 70/30) (70-30) 100 UNIT/ML injection Inject 25 Units into the skin 2 (two) times daily with a meal. (Patient not taking: Reported on 08/23/2014) 10 mL 1 Not Taking at Unknown time  . liver oil-zinc oxide (DESITIN) 40 % ointment Apply 1 application topically as needed for irritation. 56.7 g 0   . LORazepam (ATIVAN) 1 MG tablet Take 1 mg by mouth daily as needed for anxiety. anxiety   2 weeks  . nitrofurantoin, macrocrystal-monohydrate, (MACROBID) 100 MG capsule Take 1 capsule (100 mg total) by mouth 2 (two) times daily. 10 capsule 0 08/25/2014 at Unknown time    Review of Systems  Constitutional: Negative for fever.  Gastrointestinal: Positive for abdominal pain. Negative for nausea and vomiting.  Genitourinary:       No vaginal discharge. No vaginal bleeding. No dysuria.  Musculoskeletal: Positive for back pain.   Physical Exam   Blood pressure 131/75, pulse 91, temperature 98.1 F (36.7 C), temperature source Oral, resp. rate 18, last menstrual period 09/20/2015.  Physical Exam  Nursing note and vitals reviewed. Constitutional: She is oriented to person,  place, and time. She appears well-developed and well-nourished.  HENT:  Head: Normocephalic.  Eyes: EOM are normal.  Neck: Neck supple.  GI: Soft. There is no tenderness.  Musculoskeletal: Normal range of motion.  Neurological: She is alert and oriented to person, place, and time.  Skin: Skin is warm and dry.  Psychiatric: She has a normal mood and affect.    MAU Course  Procedures  Results for orders placed or performed during the hospital encounter of 11/22/15 (from the past 24 hour(s))  Urinalysis, Routine w reflex microscopic (not at [facility])     Status: Abnormal   Collection Time: 11/22/15  7:50 AM  Result Value Ref Range   Color, Urine AMBER (A) YELLOW   APPearance CLEAR CLEAR   Specific  Gravity, Urine 1.015 1.005 - 1.030   pH 5.5 5.0 - 8.0   Glucose, UA NEGATIVE NEGATIVE mg/dL   Hgb urine dipstick NEGATIVE NEGATIVE   Bilirubin Urine NEGATIVE NEGATIVE   Ketones, ur NEGATIVE NEGATIVE mg/dL   Protein, ur NEGATIVE NEGATIVE mg/dL   Nitrite NEGATIVE NEGATIVE   Leukocytes, UA TRACE (A) NEGATIVE  Urine microscopic-add on     Status: Abnormal   Collection Time: 11/22/15  7:50 AM  Result Value Ref Range   Squamous Epithelial / LPF 0-5 (A) NONE SEEN   WBC, UA 6-30 0 - 5 WBC/hpf   RBC / HPF 0-5 0 - 5 RBC/hpf   Bacteria, UA FEW (A) NONE SEEN  CBC     Status: Abnormal   Collection Time: 11/22/15  8:51 AM  Result Value Ref Range   WBC 11.6 (H) 4.0 - 10.5 K/uL   RBC 3.82 (L) 3.87 - 5.11 MIL/uL   Hemoglobin 11.2 (L) 12.0 - 15.0 g/dL   HCT 16.132.8 (L) 09.636.0 - 04.546.0 %   MCV 85.9 78.0 - 100.0 fL   MCH 29.3 26.0 - 34.0 pg   MCHC 34.1 30.0 - 36.0 g/dL   RDW 40.912.4 81.111.5 - 91.415.5 %   Platelets 333 150 - 400 K/uL     MDM CLINICAL DATA: Pelvic pain  EXAM: OBSTETRIC <14 WK US AND TRANSVAGINAL OB US  TECHNIQUE: Both transabdominal and transvaginal ultrasound examinations were performed for complete evaluation of the gestation as well as the maternal uterus, adnexal regions, and pelvic cul-de-sac. Transvaginal technique was performed to assess early pregnancy.  COMPARISON: None.  FINDINGS: Intrauterine gestational sac: Single  Yolk sac: Visualized.  Embryo: Visualized.  Cardiac Activity: Visualized.  Heart Rate: 167 bpm  CRL: 15 mm  7 w  5 d         US EDC: 07/05/2016  Subchorionic hemorrhage: Small subchorionic hemorrhage measuring 1.1 x 2 x 2.4 cm.  Maternal uterus/adnexae: No adnexal mass. Normal bilateral ovaries. Right ovary measures 3.6 x 1.9 x 2.3 cm. Left ovary measures 3.1 x 1.7 x 2.2 cm. No pelvic free fluid. 1.4 x 1.3 x 1.4 cm hypoechoic mass in the anterior uterus consistent with a small fibroid.  IMPRESSION: 1. Single live intrauterine  pregnancy as detailed above. 2. Small subchorionic hemorrhage. Attention on follow-up examination is recommended.  Assessment and Plan  IUP at 7374w5d Type 2 Diabetes on insulin  Plan [facility] prenatal visit. Will have rest of labs done there and will transfer for high risk care.  Nakota Ackert L Lemonte Al 11/22/2015, 8:41 AM

## 2015-11-22 NOTE — MAU Note (Signed)
Was gestational diabetic with 2nd pregnancy; is Type II  Insulin dependent diabetic now;

## 2015-11-23 LAB — URINALYSIS, ROUTINE W REFLEX MICROSCOPIC
Bilirubin Urine: NEGATIVE
Glucose, UA: NEGATIVE mg/dL
Hgb urine dipstick: NEGATIVE
Ketones, ur: NEGATIVE mg/dL
NITRITE: NEGATIVE
Protein, ur: NEGATIVE mg/dL
SPECIFIC GRAVITY, URINE: 1.015 (ref 1.005–1.030)
pH: 5.5 (ref 5.0–8.0)

## 2015-11-24 DIAGNOSIS — E119 Type 2 diabetes mellitus without complications: Secondary | ICD-10-CM | POA: Diagnosis not present

## 2015-11-24 DIAGNOSIS — Z3481 Encounter for supervision of other normal pregnancy, first trimester: Secondary | ICD-10-CM | POA: Diagnosis not present

## 2015-11-24 DIAGNOSIS — E669 Obesity, unspecified: Secondary | ICD-10-CM | POA: Diagnosis not present

## 2015-11-24 DIAGNOSIS — R875 Abnormal microbiological findings in specimens from female genital organs: Secondary | ICD-10-CM | POA: Diagnosis not present

## 2015-11-24 LAB — OB RESULTS CONSOLE ANTIBODY SCREEN: Antibody Screen: NEGATIVE

## 2015-11-24 LAB — OB RESULTS CONSOLE HEPATITIS B SURFACE ANTIGEN: HEP B S AG: NEGATIVE

## 2015-11-24 LAB — OB RESULTS CONSOLE ABO/RH: RH Type: POSITIVE

## 2015-11-24 LAB — OB RESULTS CONSOLE GC/CHLAMYDIA
Chlamydia: NEGATIVE
Gonorrhea: NEGATIVE

## 2015-12-03 ENCOUNTER — Encounter: Payer: Self-pay | Admitting: *Deleted

## 2015-12-08 ENCOUNTER — Ambulatory Visit: Payer: Managed Care, Other (non HMO) | Admitting: *Deleted

## 2015-12-08 ENCOUNTER — Encounter: Payer: Managed Care, Other (non HMO) | Attending: Family Medicine | Admitting: Dietician

## 2015-12-08 DIAGNOSIS — O24419 Gestational diabetes mellitus in pregnancy, unspecified control: Secondary | ICD-10-CM

## 2015-12-08 DIAGNOSIS — Z713 Dietary counseling and surveillance: Secondary | ICD-10-CM | POA: Insufficient documentation

## 2015-12-08 MED ORDER — ACCU-CHEK SOFTCLIX LANCET DEV MISC: 0 refills | Status: DC

## 2015-12-08 MED ORDER — ACCU-CHEK SOFTCLIX LANCET DEV MISC: 11 refills | Status: AC

## 2015-12-08 MED ORDER — ACCU-CHEK AVIVA PLUS W/DEVICE KIT: 1.0000 | PACK | Freq: Once | 0 refills | Status: AC

## 2015-12-08 MED ORDER — GLUCOSE BLOOD VI STRP: ORAL_STRIP | 12 refills | Status: DC

## 2015-12-08 NOTE — Progress Notes (Signed)
Diabetes Education: 12/08/15 Seen for first appointment today.  Currently G4P2 and 467w2d.  Gives hx of GDM with her last baby and needed insulin use. Gives a positive family history for diabetes type 2 with her mother and father and maternal grandfather. Glucose apparently return to normal ranges after delivery  until 2 years later when she became very tired and sick and in the emergency room was found to have an elevated glucose and was started on Novolog Mix 70/30.  Taking AC meals.  45 units in AM, 10 units mid-day and 60 units in the evening.  Has been on insulin since 2014. Currently working for [employer] 10:00 pm - 6:30 am on the third shift.  Has a irregular life style. C/O "feeling sick and nauseated." Eating small snacks and trying to avoid high concentrations of carb.  Notes her A1C about 2 weeks ago was 8.6 %. Completed review of the effects of the placenta on blood glucose levels and need for increased insulin and limiting carbs.  Provided handout "Nutrition, Diabetes and Pregnancy." Completed review of factors affecting blood glucose. Review of blood glucose monitoring, monitoring schedule, glucose goals.  She is receiving Morada Medicad and will [facility] on [address] for the Accu-Check Aviva Plus meter. Review of exercise/walking and recommended walking 15 minutes twice a day.  Can walk the halls at work when on break or at lunch. On informal demonstration this morning, glucose was 284 mg/dl. Had not taken her insulin prior to eating a sausage biscuit after getting off from work. Instructed to monitor fasting and 2 hour post prandial glucose levels; record and ring meter and glucose log to all clinic appointments. Review of carbohydrate foods, carb portions, carb counting, non-starchy vegetables, proteins and fats.  Review of recommended diet and carb for meals and snacks. Will f/u as needed. Maggie Rondia Higginbotham, RN, RD, LDN

## 2015-12-08 NOTE — Addendum Note (Signed)
Addended by: Kathee DeltonHILLMAN, CARRIE L on: 12/08/2015 02:22 PM   Modules accepted: Orders

## 2015-12-10 ENCOUNTER — Telehealth: Payer: Self-pay | Admitting: *Deleted

## 2015-12-10 NOTE — Telephone Encounter (Signed)
Pt left message yesterday stating that her glucose meter is not working and wants a prescriptions sent to pharmacy for a new one.

## 2015-12-15 ENCOUNTER — Encounter: Payer: Self-pay | Admitting: Obstetrics & Gynecology

## 2015-12-15 ENCOUNTER — Ambulatory Visit (INDEPENDENT_AMBULATORY_CARE_PROVIDER_SITE_OTHER): Payer: Managed Care, Other (non HMO) | Admitting: Obstetrics & Gynecology

## 2015-12-15 ENCOUNTER — Telehealth: Payer: Self-pay

## 2015-12-15 DIAGNOSIS — O0990 Supervision of high risk pregnancy, unspecified, unspecified trimester: Secondary | ICD-10-CM | POA: Insufficient documentation

## 2015-12-15 DIAGNOSIS — Z23 Encounter for immunization: Secondary | ICD-10-CM | POA: Diagnosis not present

## 2015-12-15 DIAGNOSIS — O34219 Maternal care for unspecified type scar from previous cesarean delivery: Secondary | ICD-10-CM

## 2015-12-15 DIAGNOSIS — O24919 Unspecified diabetes mellitus in pregnancy, unspecified trimester: Secondary | ICD-10-CM | POA: Insufficient documentation

## 2015-12-15 DIAGNOSIS — O0991 Supervision of high risk pregnancy, unspecified, first trimester: Secondary | ICD-10-CM

## 2015-12-15 LAB — POCT URINALYSIS DIP (DEVICE)
BILIRUBIN URINE: NEGATIVE
Glucose, UA: NEGATIVE mg/dL
Hgb urine dipstick: NEGATIVE
Ketones, ur: NEGATIVE mg/dL
NITRITE: NEGATIVE
Protein, ur: NEGATIVE mg/dL
Specific Gravity, Urine: 1.015 (ref 1.005–1.030)
Urobilinogen, UA: 0.2 mg/dL (ref 0.0–1.0)
pH: 6 (ref 5.0–8.0)

## 2015-12-15 LAB — GLUCOSE, CAPILLARY: Glucose-Capillary: 109 mg/dL — ABNORMAL HIGH (ref 65–99)

## 2015-12-15 MED ORDER — ASPIRIN EC 81 MG PO TBEC: 81.0000 mg | DELAYED_RELEASE_TABLET | Freq: Every day | ORAL | 7 refills | Status: DC

## 2015-12-15 MED ORDER — INSULIN ASPART 100 UNIT/ML FLEXPEN: 17.0000 [IU] | PEN_INJECTOR | Freq: Three times a day (TID) | SUBCUTANEOUS | 11 refills | Status: DC

## 2015-12-15 MED ORDER — INSULIN GLARGINE 100 UNIT/ML ~~LOC~~ SOLN: 26.0000 [IU] | SUBCUTANEOUS | Status: DC

## 2015-12-15 NOTE — Telephone Encounter (Addendum)
Per Dr. Penne LashLeggett, pt needs to be contacted in regards to her insulin medication change which is to stop 70/30 mix, start Lantus 26 units in the am and Novolog 17 units before each meal.  Pt should also be scheduled for labs per provider lab orders have been placed.  LM x 2 for pt to return call to the office it regards the need for change in medication management.

## 2015-12-15 NOTE — Progress Notes (Signed)
   PRENATAL VISIT NOTE  Subjective:  Sue Cooper is a 30 y.o. [id number] at 7868w0d being seen today for ongoing prenatal care.  She is currently monitored for the following issues for this high-risk pregnancy and has History of shoulder dystocia in prior pregnancy, currently pregnant; Gestational diabetes; Obesity; DKA, type 2, not at goal 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  Patient reports no complaints.  Contractions: Not present. Vag. Bleeding: None.  Movement: Absent. Denies leaking of fluid.   The following portions of the patient's history were reviewed and updated as appropriate: allergies, current medications, past family history, past medical history, past social history, past surgical history and problem list. Problem list updated.  Objective:   Vitals:   12/15/15 0836  BP: 119/72  Pulse: 92  Weight: 246 lb 12.8 oz (111.9 kg)    Fetal Status:     Movement: Absent     General:  Alert, oriented and cooperative. Patient is in no acute distress.  Skin: Skin is warm and dry. No rash noted.   Cardiovascular: Normal heart rate noted  Respiratory: Normal respiratory effort, no problems with respiration noted  Abdomen: Soft, gravid, appropriate for gestational age. Pain/Pressure: Absent     Pelvic:  Cervical exam deferred        Extremities: Normal range of motion.  Edema: None  Mental Status: Normal mood and affect. Normal behavior. Normal judgment and thought content.   Assessment and Plan:  Pregnancy: [id number] at 2268w0d  1. Supervision of high risk pregnancy in first trimester - US MFM Fetal Nuchal Translucency; Future - Flu Vaccine QUAD 36+ mos IM (Fluarix & Fluzone Quad PF  2. Diabetes mellitus complicating pregnancy, antepartum -pt eating much better this pregnancy.  She is having some lows on her 70\30 mix. Patient  also works third shift and converted back to for shift on the weekends. She does have good insurance and can't afford Lantus and NovoLog. This would be a better match for patient's lifestyle. We will convert her to this. -Patient needs CMP, TSH, hemoglobin A1c, and protein creatinine ratio -optho and fetal echo later in pregnancy. -Maggie the diabetes educator is not able to see her today. Patient will be scheduled with her next visit. Patient also encouraged to bring blood glucose log at her next visit. -Lantus 26 units every morning and 17 units Novalog with each meal.  Preterm labor symptoms and general obstetric precautions including but not limited to vaginal bleeding, contractions, leaking of fluid and fetal movement were reviewed in detail with the patient. Please refer to After Visit Summary for other counseling recommendations.  Return in about 1 week (around 12/22/2015).   Lesly DukesKelly H Leggett, MD

## 2015-12-15 NOTE — Telephone Encounter (Signed)
Pt's concern addressed today in OB appt.

## 2015-12-16 NOTE — Telephone Encounter (Signed)
Called patient, no answer- left message to call us back in regards to a change in your medication. Called emergency contact number & left message there as well

## 2015-12-17 ENCOUNTER — Other Ambulatory Visit: Payer: Managed Care, Other (non HMO)

## 2015-12-18 LAB — COMPREHENSIVE METABOLIC PANEL
ALT: 13 U/L (ref 6–29)
AST: 14 U/L (ref 10–30)
Albumin: 3.4 g/dL — ABNORMAL LOW (ref 3.6–5.1)
Alkaline Phosphatase: 53 U/L (ref 33–115)
BUN: 11 mg/dL (ref 7–25)
CHLORIDE: 102 mmol/L (ref 98–110)
CO2: 26 mmol/L (ref 20–31)
Calcium: 9.1 mg/dL (ref 8.6–10.2)
Creat: 0.61 mg/dL (ref 0.50–1.10)
GLUCOSE: 140 mg/dL — AB (ref 65–99)
POTASSIUM: 3.9 mmol/L (ref 3.5–5.3)
Sodium: 136 mmol/L (ref 135–146)
Total Bilirubin: 0.4 mg/dL (ref 0.2–1.2)
Total Protein: 6.5 g/dL (ref 6.1–8.1)

## 2015-12-18 LAB — PROTEIN / CREATININE RATIO, URINE
Creatinine, Urine: 138 mg/dL (ref 20–320)
PROTEIN CREATININE RATIO: 87 mg/g{creat} (ref 21–161)
TOTAL PROTEIN, URINE: 12 mg/dL (ref 5–24)

## 2015-12-18 LAB — TSH: TSH: 2.23 mIU/L

## 2015-12-22 ENCOUNTER — Other Ambulatory Visit: Payer: Managed Care, Other (non HMO)

## 2015-12-23 ENCOUNTER — Encounter (HOSPITAL_COMMUNITY): Payer: Self-pay | Admitting: Obstetrics & Gynecology

## 2015-12-29 ENCOUNTER — Ambulatory Visit: Payer: Managed Care, Other (non HMO) | Admitting: *Deleted

## 2015-12-29 ENCOUNTER — Encounter: Payer: Self-pay | Admitting: Obstetrics and Gynecology

## 2015-12-29 ENCOUNTER — Ambulatory Visit (INDEPENDENT_AMBULATORY_CARE_PROVIDER_SITE_OTHER): Payer: Managed Care, Other (non HMO) | Admitting: Obstetrics and Gynecology

## 2015-12-29 ENCOUNTER — Other Ambulatory Visit: Payer: Self-pay

## 2015-12-29 VITALS — BP 138/67 | HR 80 | Wt 249.2 lb

## 2015-12-29 DIAGNOSIS — O99210 Obesity complicating pregnancy, unspecified trimester: Secondary | ICD-10-CM

## 2015-12-29 DIAGNOSIS — O09299 Supervision of pregnancy with other poor reproductive or obstetric history, unspecified trimester: Secondary | ICD-10-CM

## 2015-12-29 DIAGNOSIS — O24419 Gestational diabetes mellitus in pregnancy, unspecified control: Secondary | ICD-10-CM

## 2015-12-29 DIAGNOSIS — E669 Obesity, unspecified: Secondary | ICD-10-CM

## 2015-12-29 DIAGNOSIS — Z6841 Body Mass Index (BMI) 40.0 and over, adult: Secondary | ICD-10-CM

## 2015-12-29 DIAGNOSIS — O24912 Unspecified diabetes mellitus in pregnancy, second trimester: Secondary | ICD-10-CM

## 2015-12-29 DIAGNOSIS — O0991 Supervision of high risk pregnancy, unspecified, first trimester: Secondary | ICD-10-CM

## 2015-12-29 DIAGNOSIS — O99212 Obesity complicating pregnancy, second trimester: Secondary | ICD-10-CM

## 2015-12-29 DIAGNOSIS — O24919 Unspecified diabetes mellitus in pregnancy, unspecified trimester: Secondary | ICD-10-CM

## 2015-12-29 LAB — POCT URINALYSIS DIP (DEVICE)
Bilirubin Urine: NEGATIVE
Glucose, UA: NEGATIVE mg/dL
HGB URINE DIPSTICK: NEGATIVE
Ketones, ur: NEGATIVE mg/dL
LEUKOCYTES UA: NEGATIVE
NITRITE: NEGATIVE
Protein, ur: NEGATIVE mg/dL
Specific Gravity, Urine: 1.02 (ref 1.005–1.030)
UROBILINOGEN UA: 0.2 mg/dL (ref 0.0–1.0)
pH: 6 (ref 5.0–8.0)

## 2015-12-29 MED ORDER — INSULIN GLARGINE 100 UNIT/ML ~~LOC~~ SOLN: SUBCUTANEOUS | 11 refills | Status: DC

## 2015-12-29 NOTE — Patient Instructions (Signed)
Discuss with the doctor today if needing the Novolog insulin before meals.   Tell physician that blood sugars are in the 60s when waking (1PM) some days. Stop all cold cereal and milk.  Switch to 2 pieces of toast with peanut butter, or melted cheese, or 2 frozen waffles with eggs, or peanut butter or 1/3 cup of nuts.

## 2015-12-29 NOTE — Progress Notes (Signed)
Prenatal Visit Note Date: 12/29/2015 [facility]  Subjective:  Sue Sheron NightingaleM Tolley is a 30 y.o. [phone] at 5261w0d being seen today for ongoing prenatal care.  She is currently monitored for the following issues for this high-risk pregnancy and has History of shoulder dystocia in prior pregnancy, currently pregnant; Obesity in pregnancy, antepartum; BMI 40.0-44.9, adult (HCC); Supervision of high-risk pregnancy; and Diabetes mellitus complicating pregnancy, antepartum on her problem list.  Patient reports issues with trying to get her insulin from the pharmacy and confusion re: her regimen   Contractions: Not present. Vag. Bleeding: None.  Movement: Absent. Denies leaking of fluid.   The following portions of the patient's history were reviewed and updated as appropriate: allergies, current medications, past family history, past medical history, past social history, past surgical history and problem list. Problem list updated.  Objective:   Vitals:   12/29/15 0956  BP: 138/67  Pulse: 80  Weight: 249 lb 3.2 oz (113 kg)    Fetal Status:     Movement: Absent     General:  Alert, oriented and cooperative. Patient is in no acute distress.  Skin: Skin is warm and dry. No rash noted.   Cardiovascular: Normal heart rate noted  Respiratory: Normal respiratory effort, no problems with respiration noted  Abdomen: Soft, gravid, appropriate for gestational age. Pain/Pressure: Absent     Pelvic:  Cervical exam deferred        Extremities: Normal range of motion.  Edema: None  Mental Status: Normal mood and affect. Normal behavior. Normal judgment and thought content.   Urinalysis:      Assessment and Plan:  Pregnancy: [id number] at 6161w0d  1. Supervision of high risk pregnancy in first trimester Routine care. I didn't see a prenatal from last visit so this was declined. Pt declines CF and hemoglobinopathy panel. Has 1st trimester u/s scheduled for later this week already.  Offer afp nv. Pt left before could do bedside u/s for FHTs; unsuccessful with doppler attempt given body habitus.  - Prenatal Profile  2. Obesity in pregnancy, antepartum Screening ECG ordered. Baseline pre-x labs from last visit negative  3. BMI 40.0-44.9, adult (HCC) See above  4. Diabetes mellitus complicating pregnancy, antepartum -Will check a1c for today -Patient, given confusion re: regimen, stayed on her 70/30 regimen. She works third shift (2200-0630) 5d per week so her book is difficult to interpret and she has difficulty completely doing her 2hr PP checks but I'm seeing PP values in the 120s-160s and fastings in the 100s-110s.  Based on when she eats and her schedule, I told her to do the lantus qday with novolog 17/17/17 as recommended by Dr. Penne LashLeggett at her last visit and to do the Lantus with her 1600-1800 meal every day, even on days she doesn't work. I told her to slightly increase it to 30 units from Leggett's 26. Will likely have to increase and pt told to call us with consistenly elevated #s  Before her next visit -pt states she gets eye exams q year and it UTD -pt to start baby asa this week -needs fetal echo for 20-24wks.    5. History of shoulder dystocia in prior pregnancy, currently pregnant D/w pt later in pregnancy  Preterm labor symptoms and general obstetric precautions including but not limited to vaginal bleeding, contractions, leaking of fluid and fetal movement were reviewed in detail with the patient. Please refer to After Visit Summary for other counseling recommendations.  Return in about 3 weeks (around  01/19/2016) for rob.   [place] Bingharlie Kaeli Nichelson, MD

## 2015-12-29 NOTE — Progress Notes (Signed)
Patient is here to review blood sugars since starting on Novolog insulin.  Patient reports that she went to the Pharmacy X2, and there was no order on file for her.  She then called up here, and someone told her that she does not need to take the Novolog. I phoned the pharmacy, and they said the prescription is on hold there, waiting for her to pick it up.  She is not sure what she needs to do.  She is seeing a doctor today, and was told to ask him.  She was  Also told to ask the physician if she needs the 17u of Novolog with her breakfast meal, since she is taking 12u now.  She admits to sometimes waking up after taking the 70/30 (working night shift) with blood sugars in the 60s.   She is now taking Novolog Mix 40u in the AM FBS today was 106.  She reports trying to limit the carbs to 2 servings at each meal and one between meals, but sometimes goes over this with between meal snacks. Suggestions given for snacks that contain 15 grams with added protein and fat to help slow the blood sugar rise down She is eating "a lot of mutigrain Chereos and milk.  She was told to stop this!.  Other suggestions given for breakfast and snacks to replace this.   Cristy FolksLinda Jaleil Renwick, RN, CDE,

## 2015-12-30 LAB — HEMOGLOBIN A1C
HEMOGLOBIN A1C: 8.2 % — AB (ref <5.7)
MEAN PLASMA GLUCOSE: 189 mg/dL

## 2015-12-30 LAB — PRENATAL PROFILE (SOLSTAS)
ANTIBODY SCREEN: NEGATIVE
BASOS ABS: 0 {cells}/uL (ref 0–200)
Basophils Relative: 0 %
EOS ABS: 0 {cells}/uL — AB (ref 15–500)
EOS PCT: 0 %
HCT: 36.2 % (ref 35.0–45.0)
HEMOGLOBIN: 11.7 g/dL (ref 11.7–15.5)
HIV 1&2 Ab, 4th Generation: NONREACTIVE
Hepatitis B Surface Ag: NEGATIVE
LYMPHS PCT: 22 %
Lymphs Abs: 2816 cells/uL (ref 850–3900)
MCH: 29.3 pg (ref 27.0–33.0)
MCHC: 32.3 g/dL (ref 32.0–36.0)
MCV: 90.5 fL (ref 80.0–100.0)
MONOS PCT: 5 %
MPV: 9.2 fL (ref 7.5–12.5)
Monocytes Absolute: 640 cells/uL (ref 200–950)
NEUTROS PCT: 73 %
Neutro Abs: 9344 cells/uL — ABNORMAL HIGH (ref 1500–7800)
PLATELETS: 355 10*3/uL (ref 140–400)
RBC: 4 MIL/uL (ref 3.80–5.10)
RDW: 13.7 % (ref 11.0–15.0)
RUBELLA: 2.62 {index} — AB (ref <0.90)
Rh Type: POSITIVE
WBC: 12.8 10*3/uL — ABNORMAL HIGH (ref 3.8–10.8)

## 2015-12-31 ENCOUNTER — Encounter (HOSPITAL_COMMUNITY): Payer: Self-pay

## 2015-12-31 ENCOUNTER — Ambulatory Visit (HOSPITAL_COMMUNITY)
Admission: RE | Admit: 2015-12-31 | Discharge: 2015-12-31 | Disposition: A | Discharge status: Home / Self Care | Payer: Managed Care, Other (non HMO) | Source: Ambulatory Visit | Attending: Obstetrics & Gynecology | Admitting: Obstetrics & Gynecology

## 2015-12-31 ENCOUNTER — Other Ambulatory Visit: Payer: Self-pay | Admitting: Obstetrics & Gynecology

## 2015-12-31 DIAGNOSIS — O24011 Pre-existing diabetes mellitus, type 1, in pregnancy, first trimester: Secondary | ICD-10-CM

## 2015-12-31 DIAGNOSIS — O0991 Supervision of high risk pregnancy, unspecified, first trimester: Secondary | ICD-10-CM

## 2015-12-31 DIAGNOSIS — Z3682 Encounter for antenatal screening for nuchal translucency: Secondary | ICD-10-CM

## 2015-12-31 DIAGNOSIS — Z36 Encounter for antenatal screening for chromosomal anomalies: Secondary | ICD-10-CM | POA: Diagnosis not present

## 2015-12-31 DIAGNOSIS — O24111 Pre-existing diabetes mellitus, type 2, in pregnancy, first trimester: Secondary | ICD-10-CM | POA: Diagnosis not present

## 2015-12-31 DIAGNOSIS — Z3A13 13 weeks gestation of pregnancy: Secondary | ICD-10-CM | POA: Diagnosis not present

## 2015-12-31 DIAGNOSIS — O99211 Obesity complicating pregnancy, first trimester: Secondary | ICD-10-CM | POA: Insufficient documentation

## 2016-01-09 ENCOUNTER — Other Ambulatory Visit (HOSPITAL_COMMUNITY): Payer: Self-pay

## 2016-01-12 ENCOUNTER — Ambulatory Visit: Payer: Managed Care, Other (non HMO) | Admitting: *Deleted

## 2016-01-12 ENCOUNTER — Other Ambulatory Visit: Payer: Self-pay

## 2016-01-12 DIAGNOSIS — O24419 Gestational diabetes mellitus in pregnancy, unspecified control: Secondary | ICD-10-CM

## 2016-01-12 DIAGNOSIS — O24919 Unspecified diabetes mellitus in pregnancy, unspecified trimester: Secondary | ICD-10-CM

## 2016-01-12 MED ORDER — GLUCOSE BLOOD VI STRP: ORAL_STRIP | 12 refills | Status: AC

## 2016-01-12 NOTE — Progress Notes (Signed)
Patient reports feeling sick to her stomach and very tired for the last week.  She has slept all day when working nights, until work time, and has only eaten 1 meal and 1 snack for the last 4 days.  Insulin dose:  Lantus 20u q HS at 9PM,  Novolog:20u ac meals   Accu-Chek Aviva meter memory:  Readings hard to determine time due to working night shift some days. Date:     FBS       2hr. pcB      2hr. pcL     2hr. pcS 12/18                       118 12/17      74             123          12/16                       122                              128 12/15      124           230                               87 12/13                       144            163            144 12/12                                         172            138  Pt. Reports that she has eaten no cereal and milk since seeing me, and no sweet drinks, and no fruit juices. She reports limiting meals to 30 grams of carbs and between meals to 15, although sometimes it is more due to hunger.    Exercise:  She is walking briskly for 10 min. After each meal, both at home, and at work.    Plan: 1.  Increase testing to 4 times per day -acB and 2hr.pc meals.   2.  Continue the exercise and try to do 15 min., if you can. 3.  Call if blood sugars when waking go over 90, and if 2hr. After go over 140 on a consistent basis. Cristy FolksLinda Ellie Spickler, RN, CDE

## 2016-01-12 NOTE — Patient Instructions (Signed)
1.  Increase testing to 4 times per day -when waking and 2hr.after meals.   2.  Continue the exercise and try to do 15 min., if you can. 3.  Call if blood sugars when waking go over 90, and if 2hr. After go over 140 on a consistent basis. 4. Continue to stop cereal and milk, and sweet drinks, and juices

## 2016-01-21 ENCOUNTER — Ambulatory Visit (INDEPENDENT_AMBULATORY_CARE_PROVIDER_SITE_OTHER): Payer: Managed Care, Other (non HMO) | Admitting: Obstetrics and Gynecology

## 2016-01-21 VITALS — BP 104/75 | HR 92 | Wt 249.7 lb

## 2016-01-21 DIAGNOSIS — O0991 Supervision of high risk pregnancy, unspecified, first trimester: Secondary | ICD-10-CM

## 2016-01-21 DIAGNOSIS — O99210 Obesity complicating pregnancy, unspecified trimester: Secondary | ICD-10-CM

## 2016-01-21 DIAGNOSIS — O24911 Unspecified diabetes mellitus in pregnancy, first trimester: Secondary | ICD-10-CM

## 2016-01-21 DIAGNOSIS — E669 Obesity, unspecified: Secondary | ICD-10-CM

## 2016-01-21 DIAGNOSIS — O99211 Obesity complicating pregnancy, first trimester: Secondary | ICD-10-CM

## 2016-01-21 DIAGNOSIS — O24919 Unspecified diabetes mellitus in pregnancy, unspecified trimester: Secondary | ICD-10-CM

## 2016-01-21 LAB — POCT URINALYSIS DIP (DEVICE)
BILIRUBIN URINE: NEGATIVE
Glucose, UA: NEGATIVE mg/dL
HGB URINE DIPSTICK: NEGATIVE
KETONES UR: NEGATIVE mg/dL
Leukocytes, UA: NEGATIVE
Nitrite: NEGATIVE
PROTEIN: NEGATIVE mg/dL
SPECIFIC GRAVITY, URINE: 1.02 (ref 1.005–1.030)
Urobilinogen, UA: 0.2 mg/dL (ref 0.0–1.0)
pH: 7 (ref 5.0–8.0)

## 2016-01-21 LAB — HEMOGLOBIN A1C
HEMOGLOBIN A1C: 7.3 % — AB (ref <5.7)
Mean Plasma Glucose: 163 mg/dL

## 2016-01-21 NOTE — Progress Notes (Signed)
C/o a little cramping sometimes, no bleeding.  Scheduled for anatomy US 02/11/16. And [facility] 03/11/16 0930

## 2016-01-21 NOTE — Progress Notes (Signed)
Prenatal Visit Note Date: 01/21/2016 [facility]  Subjective:  Sue Cooper is a [age] y.o. [phone] at [redacted]w[redacted]d being seen today for ongoing prenatal care.  She is currently monitored for the following issues for this high-risk pregnancy and has History of shoulder dystocia in prior pregnancy, currently pregnant; Obesity in pregnancy, antepartum; BMI 40.0-44.9, adult (HCC); Supervision of high-risk pregnancy; and Diabetes mellitus complicating pregnancy, antepartum on her problem list.  Patient reports no complaints.   Contractions: Not present. Vag. Bleeding: None.  Movement: Present. Denies leaking of fluid.   The following portions of the patient's history were reviewed and updated as appropriate: allergies, current medications, past family history, past medical history, past social history, past surgical history and problem list. Problem list updated.  Objective:   Vitals:   01/21/16 0959  BP: 104/75  Pulse: 92  Weight: 249 lb 11.2 oz (113.3 kg)    Fetal Status: Fetal Heart Rate (bpm): 144   Movement: Present     General:  Alert, oriented and cooperative. Patient is in no acute distress.  Skin: Skin is warm and dry. No rash noted.   Cardiovascular: Normal heart rate noted  Respiratory: Normal respiratory effort, no problems with respiration noted  Abdomen: Soft, gravid, appropriate for gestational age. Pain/Pressure: Absent     Pelvic:  Cervical exam deferred        Extremities: Normal range of motion.  Edema: None  Mental Status: Normal mood and affect. Normal behavior. Normal judgment and thought content.   Urinalysis: Urine Protein: Negative Urine Glucose: Negative  Assessment and Plan:  Pregnancy: [id number] at 792w2d  1. Supervision of high risk pregnancy in first trimester Routine care.  - US MFM OB COMP + 14 WK; Future - Alpha fetoprotein, maternal - Hemoglobin A1c  2. Diabetes mellitus complicating pregnancy, antepartum Patient forgot  log book but states 2hr PP are <120 but AM fastings are sometimes in the 100s-110s but difficult given the holiday meals. Will see back in 2wks and possible adjust regimen. Patient currently on Lantus 30 units qhs and 20/20/20 novolog. Continue baby ASA - Ambulatory referral to Pediatric Cardiology - Hemoglobin A1c  3. Obesity in pregnancy, antepartum See above.   Preterm labor symptoms and general obstetric precautions including but not limited to vaginal bleeding, contractions, leaking of fluid and fetal movement were reviewed in detail with the patient. Please refer to After Visit Summary for other counseling recommendations.  Return in about 2 weeks (around 02/04/2016).   [place] Bingharlie Venida Tsukamoto, MD

## 2016-01-22 LAB — ALPHA FETOPROTEIN, MATERNAL
AFP: 41.6 ng/mL
CURR GEST AGE: 17.1 wk
MOM FOR AFP: 1.69
Open Spina bifida: NEGATIVE
Osb Risk: 1:940 {titer}

## 2016-01-26 NOTE — L&D Delivery Note (Signed)
30 y.o. [id number] at 18109w2d delivered a viable female infant in cephalic, LOA position. Anterior shoulder delivered with ease. 60 sec delayed cord clamping. Cord clamped x2 and cut. Placenta delivered spontaneously intact, with 3VC with cystic abnormality noted. Fundus firm on exam with massage and pitocin. Good hemostasis noted.   Anesthesia: Epidural Laceration: None Suture: None Good hemostasis noted. EBL: 200 cc  Fetal arthrogryposis diagnosed during pregnancy.  Pediatric team was notified prior to delivery and was aware, to be called if the baby was distressed. After delivery, baby was taken to warmer immediately for evaluation, noted to have muscle contractures in upper and lower extremities, 6th toe on left foot. Baby had strong cry and was not in immediate distress.   Apgars: APGAR (1 MIN): 8   APGAR (5 MINS): 9   Weight: 3750g  Mom and baby recovering in LDR.   Sponge and instrument count were correct x2. Placenta sent to L&D pathology.  Howard PouchLauren Feng, MD PGY-1 Family Medicine 06/16/2016, 6:01 PM   OB FELLOW DELIVERY ATTESTATION  I was gloved and present for the delivery in its entirety, and I agree with the above resident's note.    Ernestina PennaNicholas Schenk, MD 6:40 PM

## 2016-02-04 ENCOUNTER — Ambulatory Visit (INDEPENDENT_AMBULATORY_CARE_PROVIDER_SITE_OTHER): Payer: Managed Care, Other (non HMO) | Admitting: Family Medicine

## 2016-02-04 VITALS — BP 113/69 | HR 85 | Wt 259.0 lb

## 2016-02-04 DIAGNOSIS — O09299 Supervision of pregnancy with other poor reproductive or obstetric history, unspecified trimester: Secondary | ICD-10-CM

## 2016-02-04 DIAGNOSIS — O09292 Supervision of pregnancy with other poor reproductive or obstetric history, second trimester: Secondary | ICD-10-CM

## 2016-02-04 DIAGNOSIS — O24919 Unspecified diabetes mellitus in pregnancy, unspecified trimester: Secondary | ICD-10-CM

## 2016-02-04 DIAGNOSIS — O0992 Supervision of high risk pregnancy, unspecified, second trimester: Secondary | ICD-10-CM

## 2016-02-04 DIAGNOSIS — O24912 Unspecified diabetes mellitus in pregnancy, second trimester: Secondary | ICD-10-CM

## 2016-02-04 LAB — POCT URINALYSIS DIP (DEVICE)
Bilirubin Urine: NEGATIVE
GLUCOSE, UA: NEGATIVE mg/dL
Hgb urine dipstick: NEGATIVE
Ketones, ur: NEGATIVE mg/dL
Nitrite: NEGATIVE
PROTEIN: NEGATIVE mg/dL
SPECIFIC GRAVITY, URINE: 1.02 (ref 1.005–1.030)
UROBILINOGEN UA: 0.2 mg/dL (ref 0.0–1.0)
pH: 7 (ref 5.0–8.0)

## 2016-02-04 MED ORDER — INSULIN ASPART 100 UNIT/ML FLEXPEN: 22.0000 [IU] | PEN_INJECTOR | Freq: Three times a day (TID) | SUBCUTANEOUS | 11 refills | Status: DC

## 2016-02-04 NOTE — Progress Notes (Signed)
    PRENATAL VISIT NOTE  Subjective:  Sue Cooper is a 31 y.o. [id number] at 4978w2d being seen today for ongoing prenatal care.  She is currently monitored for the following issues for this high-risk pregnancy and has History of shoulder dystocia in prior pregnancy, currently pregnant; Obesity in pregnancy, antepartum; BMI 40.0-44.9, adult (HCC); Supervision of high-risk pregnancy; and Diabetes mellitus complicating pregnancy, antepartum on her problem list.  Patient reports no complaints.  Contractions: Not present. Vag. Bleeding: None.  Movement: Present. Denies leaking of fluid.   The following portions of the patient's history were reviewed and updated as appropriate: allergies, current medications, past family history, past medical history, past social history, past surgical history and problem list. Problem list updated.  Objective:   Vitals:   02/04/16 1534  BP: 113/69  Pulse: 85  Weight: 259 lb (117.5 kg)    Fetal Status: Fetal Heart Rate (bpm): 133   Movement: Present     General:  Alert, oriented and cooperative. Patient is in no acute distress.  Skin: Skin is warm and dry. No rash noted.   Cardiovascular: Normal heart rate noted  Respiratory: Normal respiratory effort, no problems with respiration noted  Abdomen: Soft, gravid, appropriate for gestational age. Pain/Pressure: Present     Pelvic:  Cervical exam deferred        Extremities: Normal range of motion.  Edema: None  Mental Status: Normal mood and affect. Normal behavior. Normal judgment and thought content.   Assessment and Plan:  Pregnancy: [id number] at 6978w2d  1. Supervision of high risk pregnancy in second trimester Anatomy u/s scheduled.  2. Diabetes mellitus complicating pregnancy, antepartum Lab Results  Component Value Date   HGBA1C 7.3 (H) 01/21/2016   No book again--reemphasized need for bringing log every visit. Reports FBS some in 70's 2 hour pp between 120-140---adjusted Novolog to 22 u with  meals with instructions to increase further if needed. - insulin aspart (NOVOLOG) 100 UNIT/ML FlexPen; Inject 22 Units into the skin 3 (three) times daily with meals.  Dispense: 15 mL; Refill: 11  3. History of shoulder dystocia in prior pregnancy, currently pregnant Discussed implications of poor glycemic control.  Preterm labor symptoms and general obstetric precautions including but not limited to vaginal bleeding, contractions, leaking of fluid and fetal movement were reviewed in detail with the patient. Please refer to After Visit Summary for other counseling recommendations.  Return in 2 weeks (on 02/18/2016).   Reva Boresanya S Pratt, MD

## 2016-02-11 ENCOUNTER — Ambulatory Visit (HOSPITAL_COMMUNITY): Payer: Managed Care, Other (non HMO)

## 2016-02-16 ENCOUNTER — Other Ambulatory Visit: Payer: Self-pay | Admitting: Obstetrics and Gynecology

## 2016-02-16 ENCOUNTER — Ambulatory Visit (HOSPITAL_COMMUNITY)
Admission: RE | Admit: 2016-02-16 | Discharge: 2016-02-16 | Disposition: A | Discharge status: Home / Self Care | Payer: Managed Care, Other (non HMO) | Source: Ambulatory Visit | Attending: Obstetrics and Gynecology | Admitting: Obstetrics and Gynecology

## 2016-02-16 DIAGNOSIS — O24919 Unspecified diabetes mellitus in pregnancy, unspecified trimester: Secondary | ICD-10-CM

## 2016-02-16 DIAGNOSIS — Z3A20 20 weeks gestation of pregnancy: Secondary | ICD-10-CM | POA: Diagnosis not present

## 2016-02-16 DIAGNOSIS — O24112 Pre-existing diabetes mellitus, type 2, in pregnancy, second trimester: Secondary | ICD-10-CM | POA: Insufficient documentation

## 2016-02-16 DIAGNOSIS — O99212 Obesity complicating pregnancy, second trimester: Secondary | ICD-10-CM | POA: Diagnosis not present

## 2016-02-16 DIAGNOSIS — O0991 Supervision of high risk pregnancy, unspecified, first trimester: Secondary | ICD-10-CM

## 2016-02-16 DIAGNOSIS — Z3689 Encounter for other specified antenatal screening: Secondary | ICD-10-CM

## 2016-02-16 DIAGNOSIS — Z363 Encounter for antenatal screening for malformations: Secondary | ICD-10-CM | POA: Insufficient documentation

## 2016-02-16 DIAGNOSIS — O0992 Supervision of high risk pregnancy, unspecified, second trimester: Secondary | ICD-10-CM | POA: Diagnosis not present

## 2016-02-16 DIAGNOSIS — Q799 Congenital malformation of musculoskeletal system, unspecified: Secondary | ICD-10-CM | POA: Diagnosis not present

## 2016-02-16 DIAGNOSIS — O358XX0 Maternal care for other (suspected) fetal abnormality and damage, not applicable or unspecified: Secondary | ICD-10-CM | POA: Diagnosis not present

## 2016-02-16 DIAGNOSIS — O352XX0 Maternal care for (suspected) hereditary disease in fetus, not applicable or unspecified: Secondary | ICD-10-CM | POA: Diagnosis not present

## 2016-02-16 DIAGNOSIS — Q688 Other specified congenital musculoskeletal deformities: Secondary | ICD-10-CM

## 2016-02-16 DIAGNOSIS — O24012 Pre-existing diabetes mellitus, type 1, in pregnancy, second trimester: Secondary | ICD-10-CM | POA: Diagnosis not present

## 2016-02-17 ENCOUNTER — Ambulatory Visit (HOSPITAL_COMMUNITY): Payer: Managed Care, Other (non HMO)

## 2016-02-17 ENCOUNTER — Other Ambulatory Visit (HOSPITAL_COMMUNITY): Payer: Self-pay | Admitting: *Deleted

## 2016-02-17 DIAGNOSIS — IMO0002 Reserved for concepts with insufficient information to code with codable children: Secondary | ICD-10-CM

## 2016-02-18 ENCOUNTER — Encounter: Payer: Self-pay | Admitting: Obstetrics and Gynecology

## 2016-02-18 ENCOUNTER — Ambulatory Visit (INDEPENDENT_AMBULATORY_CARE_PROVIDER_SITE_OTHER): Payer: Managed Care, Other (non HMO) | Admitting: Obstetrics and Gynecology

## 2016-02-18 VITALS — BP 118/68 | HR 98 | Wt 261.5 lb

## 2016-02-18 DIAGNOSIS — O99212 Obesity complicating pregnancy, second trimester: Secondary | ICD-10-CM

## 2016-02-18 DIAGNOSIS — Q688 Other specified congenital musculoskeletal deformities: Secondary | ICD-10-CM

## 2016-02-18 DIAGNOSIS — O09299 Supervision of pregnancy with other poor reproductive or obstetric history, unspecified trimester: Secondary | ICD-10-CM

## 2016-02-18 DIAGNOSIS — O24919 Unspecified diabetes mellitus in pregnancy, unspecified trimester: Secondary | ICD-10-CM

## 2016-02-18 DIAGNOSIS — O24912 Unspecified diabetes mellitus in pregnancy, second trimester: Secondary | ICD-10-CM

## 2016-02-18 DIAGNOSIS — O0992 Supervision of high risk pregnancy, unspecified, second trimester: Secondary | ICD-10-CM

## 2016-02-18 DIAGNOSIS — O09292 Supervision of pregnancy with other poor reproductive or obstetric history, second trimester: Secondary | ICD-10-CM

## 2016-02-18 DIAGNOSIS — O99210 Obesity complicating pregnancy, unspecified trimester: Secondary | ICD-10-CM

## 2016-02-18 LAB — POCT URINALYSIS DIP (DEVICE)
Bilirubin Urine: NEGATIVE
GLUCOSE, UA: 500 mg/dL — AB
Nitrite: NEGATIVE
PROTEIN: NEGATIVE mg/dL
Specific Gravity, Urine: 1.025 (ref 1.005–1.030)
Urobilinogen, UA: 0.2 mg/dL (ref 0.0–1.0)
pH: 6 (ref 5.0–8.0)

## 2016-02-18 MED ORDER — INSULIN GLARGINE 100 UNIT/ML ~~LOC~~ SOLN: SUBCUTANEOUS | 11 refills | Status: DC

## 2016-02-18 MED ORDER — INSULIN ASPART 100 UNIT/ML FLEXPEN: 25.0000 [IU] | PEN_INJECTOR | Freq: Three times a day (TID) | SUBCUTANEOUS | 11 refills | Status: DC

## 2016-02-18 NOTE — Progress Notes (Signed)
Prenatal Visit Note Date: 02/18/2016 [facility]  Subjective:  Sue Cooper is a 31 y.o. [id number] at 8432w2d being seen today for ongoing prenatal care.  She is currently monitored for the following issues for this high-risk pregnancy and has History of shoulder dystocia in prior pregnancy, currently pregnant; Obesity in pregnancy, antepartum; BMI 40.0-44.9, adult (HCC); Supervision of high-risk pregnancy; Diabetes mellitus complicating pregnancy, antepartum; and Fetal Arthrogryposis on her problem list.  Patient reports no complaints.   Contractions: Not present. Vag. Bleeding: None.  Movement: Present. Denies leaking of fluid.   The following portions of the patient's history were reviewed and updated as appropriate: allergies, current medications, past family history, past medical history, past social history, past surgical history and problem list. Problem list updated.  Objective:   Vitals:   02/18/16 1109  BP: 118/68  Pulse: 98  Weight: 261 lb 8 oz (118.6 kg)    Fetal Status: Fetal Heart Rate (bpm): 138   Movement: Present     General:  Alert, oriented and cooperative. Patient is in no acute distress.  Skin: Skin is warm and dry. No rash noted.   Cardiovascular: Normal heart rate noted  Respiratory: Normal respiratory effort, no problems with respiration noted  Abdomen: Soft, gravid, appropriate for gestational age. Pain/Pressure: Absent     Pelvic:  Cervical exam deferred        Extremities: Normal range of motion.  Edema: Trace  Mental Status: Normal mood and affect. Normal behavior. Normal judgment and thought content.   Urinalysis: Urine Protein: Negative Urine Glucose: 3+  Assessment and Plan:  Pregnancy: [id number] at 9232w2d  1. Diabetes mellitus complicating pregnancy, antepartum Difficult to interpret log due to 3rd shift schedule but what she has as fastings and most of her 2hrs are elevated (see scanned sheet). Will increase from lantus  30 (at around 15-1600 qday) and aspart 22/22/22 to lantus 33 and aspart 25/25/25. Also will recheck a1c today. Fetal echo already scheduled for mid February. Will have pt come back in one week to see DM and then 2wks for ROB - Hemoglobin A1c - insulin aspart (NOVOLOG) 100 UNIT/ML FlexPen; Inject 25 Units into the skin 3 (three) times daily with meals.  Dispense: 15 mL; Refill: 11 - Referral to Nutrition and Diabetes Services  2. Supervision of high risk pregnancy in second trimester Routine care. Continue with baby asa - Hemoglobin A1c  3. Fetal Arthrogryposis New diagnosis. MFM following closely  4. Obesity in pregnancy, antepartum No change in plan of care  5. History of shoulder dystocia in prior pregnancy, currently pregnant D/w pt closer to delivery and based on mfm scans.   Preterm labor symptoms and general obstetric precautions including but not limited to vaginal bleeding, contractions, leaking of fluid and fetal movement were reviewed in detail with the patient. Please refer to After Visit Summary for other counseling recommendations.  Return in about 1 week (around 02/25/2016) for 1wk diabetes educator follow up and 2wk rob.   [place] Bingharlie Laquitha Heslin, MD

## 2016-02-19 ENCOUNTER — Other Ambulatory Visit: Payer: Self-pay

## 2016-02-19 DIAGNOSIS — Z3A20 20 weeks gestation of pregnancy: Secondary | ICD-10-CM | POA: Insufficient documentation

## 2016-02-19 DIAGNOSIS — O24113 Pre-existing diabetes mellitus, type 2, in pregnancy, third trimester: Secondary | ICD-10-CM

## 2016-02-19 DIAGNOSIS — O24112 Pre-existing diabetes mellitus, type 2, in pregnancy, second trimester: Secondary | ICD-10-CM

## 2016-02-19 LAB — HEMOGLOBIN A1C
Hgb A1c MFr Bld: 7.1 % — ABNORMAL HIGH (ref <5.7)
Mean Plasma Glucose: 157 mg/dL

## 2016-02-19 NOTE — Telephone Encounter (Signed)
[facility] regarding patient Lantus which was denied due to new year. The insurance company will no longer cover this. He suggested Deasagler  which is similar. Please advise.

## 2016-02-19 NOTE — Progress Notes (Signed)
Genetic Counseling  High-Risk Gestation Note  Appointment Date:  02/16/2016 Referred By: Aletha Halim, MD Date of Birth:  1985/08/28   Pregnancy History: [id number] Estimated Date of Delivery: 07/05/16 Estimated Gestational Age: 48w0dAttending: MRenella Cunas MD   I met with Ms. Sue Sue MCSORLEYand her partner for genetic counseling because of abnormal ultrasound findings.   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, very little fetal movement  Reviewed large range of etiologies for arthrogryposis including intrinsic (neurologic, myopathic, chromosome, single gene) and extrinsic (teratogens, maternal conditions) factors  Prognosis depends upon underlying etiology, which in many cases cannot be determined prenatally   Reviewed options for additional screening  NIPS- declined  [facility]'s personal history of [facility] mellitus  Expanded carrier screening panel (to screen for single gene conditions such as SMA)- declined at this time  Ongoing ultrasound to assess fetal movement, amniotic fluid volume, and fetal anatomy- follow-up scheduled 03/15/16  Reviewed options for diagnostic testing, including risks, benefits, limitations and alternatives- declined amniocentesis  Reviewed family history concerns  We began by reviewing the ultrasound in detail. Ultrasound today visualized arthrogryposis. "Lower extremities flexed and fixed at the hips and knees with an equinovarus malformation of the feet; upper extremities flexed at elbows and fixed at ~ 90 degree angle; fingers in neutral position - very little movement, never clinched or fully extended; overall, very little fetal movement noted during study (hypocoiled umbilical cord)." Complete ultrasound report under separate cover.   Arthrogryposis is defined as multiple contractures involving more than 1 area of  the body and is seen in approximately 1 in 3,000 births. We discussed with the patient that there are multiple causes for contractures observed in utero. Causes can include those that are intrinsic or extrinsic to the fetus.  We discussed that causes for arthrogryposis intrinsic to the fetus may include underlying fetal aneuploidy, neurologic abnormality, myopathy, skeletal dysplasia, connective tissue disorder, or vascular disruption. We reviewed that extrinsic causes for arthrogryposis include teratogens such as viral infections in pregnancy, intrauterine compression due to oligohydramnios or uterine anomaly, or underlying maternal condition such as myasthenia gravis. We also discussed that the underlying cause of the ultrasound findings may not be able to be determined. We discussed that the most common form of arthrogryposis, amyoplasia is typically sporadic. However, arthrogryposis is also reported to be a feature of >300 different disorders.   We reviewed genes, chromosomes and examples of patterns of inheritance. We reviewed specific examples of aneuploidy conditions. Ms. CDAMISHA WOLFFpreviously had first trimester screening, which reduced the risks for fetal Down syndrome and trisomy 18/13 (1 in >10,000 for each). We discussed that screening does not diagnose these conditions and does not assess for all chromosome conditions but adjusts the a priori risk for specific conditions to provide pregnancy specific risk assessment.  We also discussed the increased chance for other underlying chromosome aberrations, such as deletions or duplications. We spent time reviewing single gene conditions and various patterns of inheritance including autosomal dominant (inherited or de novo), autosomal recessive, and X-linked.   We discussed the additional screening and testing options of noninvasive prenatal screening (NIPS)/prenatal cell free DNA testing and expanded carrier screening (for select autosomal recessive  and X-linked conditions). Risks, benefits, and limitations were reviewed of these screens including conditions for which it assesses, variable detection rates and false positive rates.  The patient understands that this is not considered diagnostic for  these conditions, nor does it assess for all chromosome or genetic conditions prenatally. We reviewed the option of amniocentesis in pregnancy for karyotype and chromosomal microarray analysis.  Microarray analysis allows for the detection of genetic deletions and duplications that are 967 times smaller than those identified by routine chromosome analysis.  Microarray analysis is able to detect deletions or duplications of the tested regions, thus it would identify aneuploidy. It would, however, not identify a structural rearrangement or smaller deletions, duplications or point mutations. The patient understands that single gene conditions are typically not tested prenatally unless ultrasound or family history are strongly suggestive of a particular syndrome for which testing may be available. We reviewed the risks, benefits, and limitations of amniocentesis including the associated 1 in 591-638 risk of complications, such as spontaneous preterm labor and delivery. She declined amniocentesis in the pregnancy given the associated risk of complications. After careful consideration, she declined NIPS and expanded carrier screening panel at today's visit. She stated that she would possibly consider pursuing additional genetic screens pending results of the next scheduled ultrasound (03/15/16).    [name] also denied known exposure to teratogens in the pregnancy.  She also denied a personal history of medical conditions that are associated with arthrogryposis, such as maternal myasthenia gravis.   The patient understands that the prognosis for the pregnancy depends upon the underlying etiology, which she understands may not be able to be determined prior to delivery.  Additionally, recurrence risk depends upon the specific etiology. We discussed the option of ultrasound surveillance in pregnancy, given that pregnancies with arthrogryposis are at increased risk for development of polyhydramnios. We also discussed the option of a medical genetics evaluation after birth to assess for evidence of a possible genetic etiology for the contractures and for possible testing at that time for genetic conditions, if warranted.   Both family histories were reviewed and found to be contributory for birth defects. The father of the pregnancy reported that his sister had a son who died at 43 days of age. He reportedly was born at 7 months gestation and had low to no amniotic fluid due to his kidneys not developing. Reportedly, his heart was also abnormal. No information was known at today's visit regarding whether specific etiologies had been ruled out or determined. We reviewed that the described features could be suggestive of a particular underlying syndrome. Underlying causes could be inherited or sporadic and range from single gene, chromosomal, multifactorial, or environmental (teratogenic). We discussed that recurrence risk is not able to accurately be assessed without additional information regarding the etiology but that detailed ultrasound is available in pregnancies to assess fetal kidneys and heart and amniotic fluid.  Without further information regarding the provided family history, an accurate genetic risk cannot be calculated. Further genetic counseling is warranted if more information is obtained.  Ms. AAHNA ROSSA denied exposure to environmental toxins or chemical agents. She denied the use of alcohol, tobacco or street drugs. She denied significant viral illnesses during the course of her pregnancy. Her medical and surgical histories were contributory for diabetes mellitus, for which she is treated with medication.    I counseled this couple regarding the above  risks and available options.  The approximate face-to-face time with the genetic counselor was 35 minutes.  Chipper Oman, MS Certified Genetic Counselor 02/19/2016

## 2016-02-20 NOTE — Telephone Encounter (Signed)
Whatever is paid for by her insurance that is glargine is fine with me. thanks

## 2016-02-23 ENCOUNTER — Other Ambulatory Visit: Payer: Managed Care, Other (non HMO)

## 2016-02-23 MED ORDER — BASAGLAR KWIKPEN 100 UNIT/ML ~~LOC~~ SOPN: 33.0000 [IU] | PEN_INJECTOR | Freq: Every day | SUBCUTANEOUS | 1 refills | Status: DC

## 2016-02-23 NOTE — Addendum Note (Signed)
Addended by: Garret ReddishBARNES, Alexande Sheerin M on: 02/23/2016 03:55 PM   Modules accepted: Orders

## 2016-02-23 NOTE — Telephone Encounter (Signed)
Called patient and let her know that her Lantus was replaced with Basaglar d/t Medicaid not paying for Lantus any longer. Reassured her that the two meds are the same and the dose is the same. Patient voiced understanding.

## 2016-02-23 NOTE — Telephone Encounter (Signed)
The off brand is [facility], [facility] pharmacist said its the same as Lantus, same dosing. I ordered it for her and let her know about the change.

## 2016-03-04 ENCOUNTER — Ambulatory Visit (INDEPENDENT_AMBULATORY_CARE_PROVIDER_SITE_OTHER): Payer: Self-pay | Admitting: Obstetrics and Gynecology

## 2016-03-04 VITALS — BP 117/72 | HR 100 | Wt 261.4 lb

## 2016-03-04 DIAGNOSIS — O0992 Supervision of high risk pregnancy, unspecified, second trimester: Secondary | ICD-10-CM

## 2016-03-04 DIAGNOSIS — O24919 Unspecified diabetes mellitus in pregnancy, unspecified trimester: Secondary | ICD-10-CM

## 2016-03-04 DIAGNOSIS — O24912 Unspecified diabetes mellitus in pregnancy, second trimester: Secondary | ICD-10-CM

## 2016-03-04 DIAGNOSIS — O09299 Supervision of pregnancy with other poor reproductive or obstetric history, unspecified trimester: Secondary | ICD-10-CM

## 2016-03-04 DIAGNOSIS — O09292 Supervision of pregnancy with other poor reproductive or obstetric history, second trimester: Secondary | ICD-10-CM

## 2016-03-04 DIAGNOSIS — Q688 Other specified congenital musculoskeletal deformities: Secondary | ICD-10-CM

## 2016-03-04 LAB — POCT URINALYSIS DIP (DEVICE)
Bilirubin Urine: NEGATIVE
Glucose, UA: NEGATIVE mg/dL
HGB URINE DIPSTICK: NEGATIVE
Nitrite: NEGATIVE
PROTEIN: NEGATIVE mg/dL
SPECIFIC GRAVITY, URINE: 1.02 (ref 1.005–1.030)
UROBILINOGEN UA: 0.2 mg/dL (ref 0.0–1.0)
pH: 7.5 (ref 5.0–8.0)

## 2016-03-04 NOTE — Progress Notes (Signed)
Subjective:  Sue Cooper is a 31 y.o. [phone] at 485w3d being seen today for ongoing prenatal care.  She is currently monitored for the following issues for this high-risk pregnancy and has History of shoulder dystocia in prior pregnancy, currently pregnant; Obesity in pregnancy, antepartum; BMI 40.0-44.9, adult (HCC); Supervision of high-risk pregnancy; Diabetes mellitus complicating pregnancy, antepartum; Fetal Arthrogryposis; and [redacted] weeks gestation of pregnancy on her problem list.  Patient reports no complaints.  Contractions: Not present. Vag. Bleeding: None.  Movement: Present. Denies leaking of fluid.   The following portions of the patient's history were reviewed and updated as appropriate: allergies, current medications, past family history, past medical history, past social history, past surgical history and problem list. Problem list updated.  Objective:   Vitals:   03/04/16 1119  BP: 117/72  Pulse: 100  Weight: 261 lb 6.4 oz (118.6 kg)    Fetal Status: Fetal Heart Rate (bpm): 141   Movement: Present     General:  Alert, oriented and cooperative. Patient is in no acute distress.  Skin: Skin is warm and dry. No rash noted.   Cardiovascular: Normal heart rate noted  Respiratory: Normal respiratory effort, no problems with respiration noted  Abdomen: Soft, gravid, appropriate for gestational age. Pain/Pressure: Present     Pelvic:  Cervical exam deferred        Extremities: Normal range of motion.  Edema: Trace  Mental Status: Normal mood and affect. Normal behavior. Normal judgment and thought content.   Urinalysis:      Assessment and Plan:  Pregnancy: [id number] at 165w3d  1. Fetal Arthrogryposis Followed by MFM  2. History of shoulder dystocia in prior pregnancy, currently pregnant Will monitor fetal weight  3. Supervision of high risk pregnancy in second trimester Stable  4. Diabetes mellitus complicating pregnancy, antepartum BS improved with increased insulin  regiment from last visit. Some elevated BS appears to be diet related Has appointment with DM educator/Nutrionist next week  Preterm labor symptoms and general obstetric precautions including but not limited to vaginal bleeding, contractions, leaking of fluid and fetal movement were reviewed in detail with the patient. Please refer to After Visit Summary for other counseling recommendations.  Return in about 2 weeks (around 03/18/2016) for OB visit.   Hermina StaggersMichael L Tenea Sens, MD

## 2016-03-04 NOTE — Progress Notes (Signed)
Tylenol doesn't seem to be working for back pain.

## 2016-03-11 ENCOUNTER — Ambulatory Visit: Payer: Managed Care, Other (non HMO) | Admitting: *Deleted

## 2016-03-15 ENCOUNTER — Ambulatory Visit (HOSPITAL_COMMUNITY)
Admission: RE | Admit: 2016-03-15 | Discharge: 2016-03-15 | Disposition: A | Discharge status: Home / Self Care | Payer: Managed Care, Other (non HMO) | Source: Ambulatory Visit | Attending: Obstetrics and Gynecology | Admitting: Obstetrics and Gynecology

## 2016-03-15 ENCOUNTER — Encounter (HOSPITAL_COMMUNITY): Payer: Self-pay

## 2016-03-15 DIAGNOSIS — O358XX0 Maternal care for other (suspected) fetal abnormality and damage, not applicable or unspecified: Secondary | ICD-10-CM | POA: Diagnosis not present

## 2016-03-15 DIAGNOSIS — O24112 Pre-existing diabetes mellitus, type 2, in pregnancy, second trimester: Secondary | ICD-10-CM | POA: Diagnosis not present

## 2016-03-15 DIAGNOSIS — Z3A24 24 weeks gestation of pregnancy: Secondary | ICD-10-CM | POA: Diagnosis not present

## 2016-03-15 DIAGNOSIS — O99212 Obesity complicating pregnancy, second trimester: Secondary | ICD-10-CM | POA: Diagnosis not present

## 2016-03-15 DIAGNOSIS — IMO0002 Reserved for concepts with insufficient information to code with codable children: Secondary | ICD-10-CM

## 2016-03-15 DIAGNOSIS — O359XX0 Maternal care for (suspected) fetal abnormality and damage, unspecified, not applicable or unspecified: Secondary | ICD-10-CM | POA: Diagnosis not present

## 2016-03-22 ENCOUNTER — Encounter: Payer: Managed Care, Other (non HMO) | Admitting: Obstetrics & Gynecology

## 2016-03-24 ENCOUNTER — Ambulatory Visit: Payer: Managed Care, Other (non HMO) | Admitting: *Deleted

## 2016-03-26 ENCOUNTER — Telehealth: Payer: Self-pay

## 2016-03-26 NOTE — Telephone Encounter (Signed)
FMLA PAPER WORK COMPLETED

## 2016-04-08 ENCOUNTER — Encounter: Payer: Managed Care, Other (non HMO) | Admitting: Obstetrics and Gynecology

## 2016-04-12 ENCOUNTER — Ambulatory Visit (INDEPENDENT_AMBULATORY_CARE_PROVIDER_SITE_OTHER): Payer: Managed Care, Other (non HMO) | Admitting: Obstetrics and Gynecology

## 2016-04-12 VITALS — BP 135/68 | HR 93 | Wt 267.7 lb

## 2016-04-12 DIAGNOSIS — O0993 Supervision of high risk pregnancy, unspecified, third trimester: Secondary | ICD-10-CM | POA: Diagnosis not present

## 2016-04-12 DIAGNOSIS — O24919 Unspecified diabetes mellitus in pregnancy, unspecified trimester: Secondary | ICD-10-CM

## 2016-04-12 DIAGNOSIS — Q688 Other specified congenital musculoskeletal deformities: Secondary | ICD-10-CM

## 2016-04-12 DIAGNOSIS — O24913 Unspecified diabetes mellitus in pregnancy, third trimester: Secondary | ICD-10-CM

## 2016-04-12 DIAGNOSIS — Z23 Encounter for immunization: Secondary | ICD-10-CM

## 2016-04-12 LAB — POCT URINALYSIS DIP (DEVICE)
BILIRUBIN URINE: NEGATIVE
GLUCOSE, UA: 500 mg/dL — AB
Hgb urine dipstick: NEGATIVE
NITRITE: NEGATIVE
PH: 7 (ref 5.0–8.0)
PROTEIN: NEGATIVE mg/dL
Specific Gravity, Urine: 1.02 (ref 1.005–1.030)
Urobilinogen, UA: 0.2 mg/dL (ref 0.0–1.0)

## 2016-04-12 MED ORDER — INSULIN ASPART 100 UNIT/ML FLEXPEN: 30.0000 [IU] | PEN_INJECTOR | Freq: Three times a day (TID) | SUBCUTANEOUS | 11 refills | Status: DC

## 2016-04-12 NOTE — Patient Instructions (Signed)
Third Trimester of Pregnancy The third trimester is from week 28 through week 40 (months 7 through 9). The third trimester is a time when the unborn baby (fetus) is growing rapidly. At the end of the ninth month, the fetus is about 20 inches in length and weighs 6-10 pounds. Body changes during your third trimester Your body will continue to go through many changes during pregnancy. The changes vary from woman to woman. During the third trimester:  Your weight will continue to increase. You can expect to gain 25-35 pounds (11-16 kg) by the end of the pregnancy.  You may begin to get stretch marks on your hips, abdomen, and breasts.  You may urinate more often because the fetus is moving lower into your pelvis and pressing on your bladder.  You may develop or continue to have heartburn. This is caused by increased hormones that slow down muscles in the digestive tract.  You may develop or continue to have constipation because increased hormones slow digestion and cause the muscles that push waste through your intestines to relax.  You may develop hemorrhoids. These are swollen veins (varicose veins) in the rectum that can itch or be painful.  You may develop swollen, bulging veins (varicose veins) in your legs.  You may have increased body aches in the pelvis, back, or thighs. This is due to weight gain and increased hormones that are relaxing your joints.  You may have changes in your hair. These can include thickening of your hair, rapid growth, and changes in texture. Some women also have hair loss during or after pregnancy, or hair that feels dry or thin. Your hair will most likely return to normal after your baby is born.  Your breasts will continue to grow and they will continue to become tender. A yellow fluid (colostrum) may leak from your breasts. This is the first milk you are producing for your baby.  Your belly button may stick out.  You may notice more swelling in your hands,  face, or ankles.  You may have increased tingling or numbness in your hands, arms, and legs. The skin on your belly may also feel numb.  You may feel short of breath because of your expanding uterus.  You may have more problems sleeping. This can be caused by the size of your belly, increased need to urinate, and an increase in your body's metabolism.  You may notice the fetus "dropping," or moving lower in your abdomen (lightening).  You may have increased vaginal discharge.  You may notice your joints feel loose and you may have pain around your pelvic bone.  What to expect at prenatal visits You will have prenatal exams every 2 weeks until week 36. Then you will have weekly prenatal exams. During a routine prenatal visit:  You will be weighed to make sure you and the baby are growing normally.  Your blood pressure will be taken.  Your abdomen will be measured to track your baby's growth.  The fetal heartbeat will be listened to.  Any test results from the previous visit will be discussed.  You may have a cervical check near your due date to see if your cervix has softened or thinned (effaced).  You will be tested for Group B streptococcus. This happens between 35 and 37 weeks.  Your health care provider may ask you:  What your birth plan is.  How you are feeling.  If you are feeling the baby move.  If you have had   any abnormal symptoms, such as leaking fluid, bleeding, severe headaches, or abdominal cramping.  If you are using any tobacco products, including cigarettes, chewing tobacco, and electronic cigarettes.  If you have any questions.  Other tests or screenings that may be performed during your third trimester include:  Blood tests that check for low iron levels (anemia).  Fetal testing to check the health, activity level, and growth of the fetus. Testing is done if you have certain medical conditions or if there are problems during the  pregnancy.  Nonstress test (NST). This test checks the health of your baby to make sure there are no signs of problems, such as the baby not getting enough oxygen. During this test, a belt is placed around your belly. The baby is made to move, and its heart rate is monitored during movement.  What is false labor? False labor is a condition in which you feel small, irregular tightenings of the muscles in the womb (contractions) that usually go away with rest, changing position, or drinking water. These are called Braxton Hicks contractions. Contractions may last for hours, days, or even weeks before true labor sets in. If contractions come at regular intervals, become more frequent, increase in intensity, or become painful, you should see your health care provider. What are the signs of labor?  Abdominal cramps.  Regular contractions that start at 10 minutes apart and become stronger and more frequent with time.  Contractions that start on the top of the uterus and spread down to the lower abdomen and back.  Increased pelvic pressure and dull back pain.  A watery or bloody mucus discharge that comes from the vagina.  Leaking of amniotic fluid. This is also known as your "water breaking." It could be a slow trickle or a gush. Let your health care provider know if it has a color or strange odor. If you have any of these signs, call your health care provider right away, even if it is before your due date. Follow these instructions at home: Medicines  Follow your health care provider's instructions regarding medicine use. Specific medicines may be either safe or unsafe to take during pregnancy.  Take a prenatal vitamin that contains at least 600 micrograms (mcg) of folic acid.  If you develop constipation, try taking a stool softener if your health care provider approves. Eating and drinking  Eat a balanced diet that includes fresh fruits and vegetables, whole grains, good sources of protein  such as meat, eggs, or tofu, and low-fat dairy. Your health care provider will help you determine the amount of weight gain that is right for you.  Avoid raw meat and uncooked cheese. These carry germs that can cause birth defects in the baby.  If you have low calcium intake from food, talk to your health care provider about whether you should take a daily calcium supplement.  Eat four or five small meals rather than three large meals a day.  Limit foods that are high in fat and processed sugars, such as fried and sweet foods.  To prevent constipation: ? Drink enough fluid to keep your urine clear or pale yellow. ? Eat foods that are high in fiber, such as fresh fruits and vegetables, whole grains, and beans. Activity  Exercise only as directed by your health care provider. Most women can continue their usual exercise routine during pregnancy. Try to exercise for 30 minutes at least 5 days a week. Stop exercising if you experience uterine contractions.  Avoid heavy   lifting.  Do not exercise in extreme heat or humidity, or at high altitudes.  Wear low-heel, comfortable shoes.  Practice good posture.  You may continue to have sex unless your health care provider tells you otherwise. Relieving pain and discomfort  Take frequent breaks and rest with your legs elevated if you have leg cramps or low back pain.  Take warm sitz baths to soothe any pain or discomfort caused by hemorrhoids. Use hemorrhoid cream if your health care provider approves.  Wear a good support bra to prevent discomfort from breast tenderness.  If you develop varicose veins: ? Wear support pantyhose or compression stockings as told by your healthcare provider. ? Elevate your feet for 15 minutes, 3-4 times a day. Prenatal care  Write down your questions. Take them to your prenatal visits.  Keep all your prenatal visits as told by your health care provider. This is important. Safety  Wear your seat belt at  all times when driving.  Make a list of emergency phone numbers, including numbers for family, friends, the hospital, and police and fire departments. General instructions  Avoid cat litter boxes and soil used by cats. These carry germs that can cause birth defects in the baby. If you have a cat, ask someone to clean the litter box for you.  Do not travel far distances unless it is absolutely necessary and only with the approval of your health care provider.  Do not use hot tubs, steam rooms, or saunas.  Do not drink alcohol.  Do not use any products that contain nicotine or tobacco, such as cigarettes and e-cigarettes. If you need help quitting, ask your health care provider.  Do not use any medicinal herbs or unprescribed drugs. These chemicals affect the formation and growth of the baby.  Do not douche or use tampons or scented sanitary pads.  Do not cross your legs for long periods of time.  To prepare for the arrival of your baby: ? Take prenatal classes to understand, practice, and ask questions about labor and delivery. ? Make a trial run to the hospital. ? Visit the hospital and tour the maternity area. ? Arrange for maternity or paternity leave through employers. ? Arrange for family and friends to take care of pets while you are in the hospital. ? Purchase a rear-facing car seat and make sure you know how to install it in your car. ? Pack your hospital bag. ? Prepare the baby's nursery. Make sure to remove all pillows and stuffed animals from the baby's crib to prevent suffocation.  Visit your dentist if you have not gone during your pregnancy. Use a soft toothbrush to brush your teeth and be gentle when you floss. Contact a health care provider if:  You are unsure if you are in labor or if your water has broken.  You become dizzy.  You have mild pelvic cramps, pelvic pressure, or nagging pain in your abdominal area.  You have lower back pain.  You have persistent  nausea, vomiting, or diarrhea.  You have an unusual or bad smelling vaginal discharge.  You have pain when you urinate. Get help right away if:  Your water breaks before 37 weeks.  You have regular contractions less than 5 minutes apart before 37 weeks.  You have a fever.  You are leaking fluid from your vagina.  You have spotting or bleeding from your vagina.  You have severe abdominal pain or cramping.  You have rapid weight loss or weight gain.    You have shortness of breath with chest pain.  You notice sudden or extreme swelling of your face, hands, ankles, feet, or legs.  Your baby makes fewer than 10 movements in 2 hours.  You have severe headaches that do not go away when you take medicine.  You have vision changes. Summary  The third trimester is from week 28 through week 40, months 7 through 9. The third trimester is a time when the unborn baby (fetus) is growing rapidly.  During the third trimester, your discomfort may increase as you and your baby continue to gain weight. You may have abdominal, leg, and back pain, sleeping problems, and an increased need to urinate.  During the third trimester your breasts will keep growing and they will continue to become tender. A yellow fluid (colostrum) may leak from your breasts. This is the first milk you are producing for your baby.  False labor is a condition in which you feel small, irregular tightenings of the muscles in the womb (contractions) that eventually go away. These are called Braxton Hicks contractions. Contractions may last for hours, days, or even weeks before true labor sets in.  Signs of labor can include: abdominal cramps; regular contractions that start at 10 minutes apart and become stronger and more frequent with time; watery or bloody mucus discharge that comes from the vagina; increased pelvic pressure and dull back pain; and leaking of amniotic fluid. This information is not intended to replace advice  given to you by your health care provider. Make sure you discuss any questions you have with your health care provider. Document Released: 01/05/2001 Document Revised: 06/19/2015 Document Reviewed: 03/14/2012 [employer] Interactive Patient Education  2017 [employer]  

## 2016-04-12 NOTE — Progress Notes (Signed)
   PRENATAL VISIT NOTE  Subjective:  Sue Cooper is a 31 y.o. [phone] at 5349w0d being seen today for ongoing prenatal care.  She is currently monitored for the following issues for this high-risk pregnancy and has History of shoulder dystocia in prior pregnancy, currently pregnant; Obesity in pregnancy, antepartum; BMI 40.0-44.9, adult (HCC); Supervision of high-risk pregnancy; Diabetes mellitus complicating pregnancy, antepartum; Fetal Arthrogryposis; and [redacted] weeks gestation of pregnancy on her problem list.  Patient reports Pateint reports she has increased her novolog from 25 to 30 on her own. With this increase she has notived her fasting BG decrease from 100's to 80's. She reports post prandial sugars are stable around 120-140. She does not have a log and is very vague about her sugars. .  Contractions: Irritability. Vag. Bleeding: None.  Movement: Present. Denies leaking of fluid.   The following portions of the patient's history were reviewed and updated as appropriate: allergies, current medications, past family history, past medical history, past social history, past surgical history and problem list. Problem list updated.  Objective:   Vitals:   04/12/16 0844  BP: 135/68  Pulse: 93  Weight: 267 lb 11.2 oz (121.4 kg)    Fetal Status: Fetal Heart Rate (bpm): 130   Movement: Present     General:  Alert, oriented and cooperative. Patient is in no acute distress.  Skin: Skin is warm and dry. No rash noted.   Cardiovascular: Normal heart rate noted  Respiratory: Normal respiratory effort, no problems with respiration noted  Abdomen: Soft, gravid, appropriate for gestational age. Pain/Pressure: Absent     Pelvic:  Cervical exam deferred        Extremities: Normal range of motion.  Edema: Mild pitting, slight indentation  Mental Status: Normal mood and affect. Normal behavior. Normal judgment and thought content.   Assessment and Plan:  Pregnancy: [id number] at 3049w0d  1.  Supervision of high risk pregnancy in third trimester - CBC - HIV antibody (with reflex) - RPR - US MFM OB FOLLOW UP; Future - 28 wk labs today  2. Need for Tdap vaccination - Tdap vaccine greater than or equal to 7yo IM  3. DMII, GDMB Patient is taking novolog increase on her own from 25 units 30 untis TID and glargine 33 units with evening meal. She does not have log and is very vague about sugars -noted to have 3+ glucosuria -taking baby ASA -encourage patient to bring log with next visit.  -start NST's at 32 wk visit -see in 2 wks to check on sugars.  4. Fetal arthrogryposis Encourage patient to get follow up US Discussed that although not nessecarily a reason for c-section, if baby remains breech, is larger than her last child which has a 1.5 minute dystocia could consder c-section.   Preterm labor symptoms and general obstetric precautions including but not limited to vaginal bleeding, contractions, leaking of fluid and fetal movement were reviewed in detail with the patient. Please refer to After Visit Summary for other counseling recommendations.  No Follow-up on file.   Lorne SkeensNicholas Michael Mariabella Nilsen, MD

## 2016-04-13 ENCOUNTER — Ambulatory Visit (HOSPITAL_COMMUNITY)
Admission: RE | Admit: 2016-04-13 | Discharge: 2016-04-13 | Disposition: A | Discharge status: Home / Self Care | Payer: Managed Care, Other (non HMO) | Source: Ambulatory Visit | Attending: Obstetrics and Gynecology | Admitting: Obstetrics and Gynecology

## 2016-04-13 ENCOUNTER — Telehealth: Payer: Self-pay | Admitting: *Deleted

## 2016-04-13 ENCOUNTER — Encounter (HOSPITAL_COMMUNITY): Payer: Self-pay

## 2016-04-13 ENCOUNTER — Other Ambulatory Visit (HOSPITAL_COMMUNITY): Payer: Self-pay | Admitting: *Deleted

## 2016-04-13 DIAGNOSIS — O24113 Pre-existing diabetes mellitus, type 2, in pregnancy, third trimester: Secondary | ICD-10-CM | POA: Diagnosis not present

## 2016-04-13 DIAGNOSIS — Z3A28 28 weeks gestation of pregnancy: Secondary | ICD-10-CM | POA: Diagnosis not present

## 2016-04-13 DIAGNOSIS — Q688 Other specified congenital musculoskeletal deformities: Secondary | ICD-10-CM | POA: Diagnosis not present

## 2016-04-13 DIAGNOSIS — O358XX0 Maternal care for other (suspected) fetal abnormality and damage, not applicable or unspecified: Secondary | ICD-10-CM | POA: Insufficient documentation

## 2016-04-13 DIAGNOSIS — O0993 Supervision of high risk pregnancy, unspecified, third trimester: Secondary | ICD-10-CM

## 2016-04-13 DIAGNOSIS — O99213 Obesity complicating pregnancy, third trimester: Secondary | ICD-10-CM | POA: Diagnosis not present

## 2016-04-13 LAB — CBC
HEMOGLOBIN: 11 g/dL — AB (ref 11.1–15.9)
Hematocrit: 34.6 % (ref 34.0–46.6)
MCH: 28 pg (ref 26.6–33.0)
MCHC: 31.8 g/dL (ref 31.5–35.7)
MCV: 88 fL (ref 79–97)
PLATELETS: 337 10*3/uL (ref 150–379)
RBC: 3.93 x10E6/uL (ref 3.77–5.28)
RDW: 14 % (ref 12.3–15.4)
WBC: 11.2 10*3/uL — AB (ref 3.4–10.8)

## 2016-04-13 LAB — RPR: RPR Ser Ql: NONREACTIVE

## 2016-04-13 LAB — HIV ANTIBODY (ROUTINE TESTING W REFLEX): HIV SCREEN 4TH GENERATION: NONREACTIVE

## 2016-04-13 NOTE — Addendum Note (Signed)
Encounter addended by: Lenoard Adenlivia M Eivan Gallina, RT on: 04/13/2016  4:11 PM<BR>    Actions taken: Imaging Exam ended

## 2016-04-13 NOTE — Telephone Encounter (Addendum)
Pt left message stating that she has FMLA papers to be completed. Her PCP told her to check with our office first since she is pregnant. Please call back.   Pt was notified on 4/4 that FMLA paperwork had been completed and faxed as requested.

## 2016-04-26 ENCOUNTER — Ambulatory Visit (INDEPENDENT_AMBULATORY_CARE_PROVIDER_SITE_OTHER): Payer: Managed Care, Other (non HMO) | Admitting: Obstetrics and Gynecology

## 2016-04-26 VITALS — BP 114/51 | HR 101 | Wt 264.9 lb

## 2016-04-26 DIAGNOSIS — O0993 Supervision of high risk pregnancy, unspecified, third trimester: Secondary | ICD-10-CM

## 2016-04-26 DIAGNOSIS — E669 Obesity, unspecified: Secondary | ICD-10-CM

## 2016-04-26 DIAGNOSIS — O99210 Obesity complicating pregnancy, unspecified trimester: Secondary | ICD-10-CM

## 2016-04-26 DIAGNOSIS — Z6841 Body Mass Index (BMI) 40.0 and over, adult: Secondary | ICD-10-CM

## 2016-04-26 DIAGNOSIS — Q688 Other specified congenital musculoskeletal deformities: Secondary | ICD-10-CM

## 2016-04-26 DIAGNOSIS — O24919 Unspecified diabetes mellitus in pregnancy, unspecified trimester: Secondary | ICD-10-CM

## 2016-04-26 DIAGNOSIS — O09299 Supervision of pregnancy with other poor reproductive or obstetric history, unspecified trimester: Secondary | ICD-10-CM

## 2016-04-26 DIAGNOSIS — O24913 Unspecified diabetes mellitus in pregnancy, third trimester: Secondary | ICD-10-CM

## 2016-04-26 DIAGNOSIS — O09293 Supervision of pregnancy with other poor reproductive or obstetric history, third trimester: Secondary | ICD-10-CM

## 2016-04-26 NOTE — Progress Notes (Signed)
Prenatal Visit Note Date: 04/26/2016 Clinic: [facility]  Subjective:  Sue Cooper is a 31 y.o. [phone] at [redacted]w[redacted]d being seen today for ongoing prenatal care.  She is currently monitored for the following issues for this high-risk pregnancy and has History of shoulder dystocia in prior pregnancy, currently pregnant; Obesity in pregnancy, antepartum; BMI 40.0-44.9, adult (HCC); Supervision of high-risk pregnancy; Diabetes mellitus complicating pregnancy, antepartum; and Fetal Arthrogryposis on her problem list.  Patient reports no complaints.  Just some low belly pressure but no s/s of PTL Contractions: Irregular. Vag. Bleeding: None.  Movement: Present. Denies leaking of fluid.   The following portions of the patient's history were reviewed and updated as appropriate: allergies, current medications, past family history, past medical history, past social history, past surgical history and problem list. Problem list updated.  Objective:   Vitals:   04/26/16 0836  BP: (!) 114/51  Pulse: (!) 101  Weight: 264 lb 14.4 oz (120.2 kg)    Fetal Status: Fetal Heart Rate (bpm): 130   Movement: Present     General:  Alert, oriented and cooperative. Patient is in no acute distress.  Skin: Skin is warm and dry. No rash noted.   Cardiovascular: Normal heart rate noted  Respiratory: Normal respiratory effort, no problems with respiration noted  Abdomen: Soft, gravid, appropriate for gestational age. Pain/Pressure: Present     Pelvic:  Cervical exam deferred        Extremities: Normal range of motion.  Edema: Mild pitting, slight indentation  Mental Status: Normal mood and affect. Normal behavior. Normal judgment and thought content.   Urinalysis:      Assessment and Plan:  Pregnancy: [id number] at [redacted]w[redacted]d  1. Supervision of high risk pregnancy in third trimester Routine care. Likely no BTL (1st child with current partner)  2. Obesity in pregnancy, antepartum See below  3.  History of shoulder dystocia in prior pregnancy, currently pregnant History reviewed in chart. D/w pt that will talk with her further about this at around 36-37wks and another growth u/s  4. BMI 40.0-44.9, adult (HCC) See below  5. Fetal Arthrogryposis Followed by MFM. Getting serial growth u/s.   6. Diabetes mellitus complicating pregnancy, antepartum Patient currently on novolog 30/30/30 and Lantus 30 at 16-1800 daily. Didn't bring log but but gives normal fasting # and 2hr PP values. D/w her that will start 2x/week ap testing at 32wks. Fetal echo for 1wk already scheduled.   Preterm labor symptoms and general obstetric precautions including but not limited to vaginal bleeding, contractions, leaking of fluid and fetal movement were reviewed in detail with the patient. Please refer to After Visit Summary for other counseling recommendations.  Return in about 2 weeks (around 05/10/2016).   [place], MD

## 2016-04-26 NOTE — Progress Notes (Signed)
Patient states pelvic area sore when she gets up and move around.

## 2016-04-28 ENCOUNTER — Telehealth: Payer: Self-pay

## 2016-04-28 DIAGNOSIS — R69 Illness, unspecified: Secondary | ICD-10-CM | POA: Diagnosis not present

## 2016-04-28 NOTE — Telephone Encounter (Signed)
Notified pt that FMLA paperwork has been filled out intermittently as she requested and faxed to [facility].    I also advised pt that she would need to bring more paperwork to be filled out for delivery and that it will be an additional $15 charge.  Pt stated understanding with no further questions.

## 2016-04-30 DIAGNOSIS — H5210 Myopia, unspecified eye: Secondary | ICD-10-CM | POA: Diagnosis not present

## 2016-05-11 ENCOUNTER — Other Ambulatory Visit: Payer: Self-pay | Admitting: Obstetrics & Gynecology

## 2016-05-11 ENCOUNTER — Encounter (HOSPITAL_COMMUNITY): Payer: Self-pay

## 2016-05-11 ENCOUNTER — Ambulatory Visit (INDEPENDENT_AMBULATORY_CARE_PROVIDER_SITE_OTHER): Payer: Managed Care, Other (non HMO) | Admitting: Obstetrics & Gynecology

## 2016-05-11 ENCOUNTER — Ambulatory Visit (HOSPITAL_COMMUNITY)
Admission: RE | Admit: 2016-05-11 | Discharge: 2016-05-11 | Disposition: A | Discharge status: Home / Self Care | Payer: Managed Care, Other (non HMO) | Source: Ambulatory Visit | Attending: Obstetrics and Gynecology | Admitting: Obstetrics and Gynecology

## 2016-05-11 ENCOUNTER — Encounter: Payer: Self-pay | Admitting: Obstetrics & Gynecology

## 2016-05-11 VITALS — BP 116/67 | HR 102 | Wt 268.4 lb

## 2016-05-11 DIAGNOSIS — Z3A32 32 weeks gestation of pregnancy: Secondary | ICD-10-CM

## 2016-05-11 DIAGNOSIS — O09293 Supervision of pregnancy with other poor reproductive or obstetric history, third trimester: Secondary | ICD-10-CM | POA: Insufficient documentation

## 2016-05-11 DIAGNOSIS — O0993 Supervision of high risk pregnancy, unspecified, third trimester: Secondary | ICD-10-CM

## 2016-05-11 DIAGNOSIS — O24919 Unspecified diabetes mellitus in pregnancy, unspecified trimester: Secondary | ICD-10-CM

## 2016-05-11 DIAGNOSIS — O24913 Unspecified diabetes mellitus in pregnancy, third trimester: Secondary | ICD-10-CM

## 2016-05-11 DIAGNOSIS — Q688 Other specified congenital musculoskeletal deformities: Secondary | ICD-10-CM

## 2016-05-11 DIAGNOSIS — O358XX0 Maternal care for other (suspected) fetal abnormality and damage, not applicable or unspecified: Secondary | ICD-10-CM | POA: Insufficient documentation

## 2016-05-11 DIAGNOSIS — O99213 Obesity complicating pregnancy, third trimester: Secondary | ICD-10-CM | POA: Insufficient documentation

## 2016-05-11 DIAGNOSIS — O24113 Pre-existing diabetes mellitus, type 2, in pregnancy, third trimester: Secondary | ICD-10-CM | POA: Insufficient documentation

## 2016-05-11 NOTE — Progress Notes (Signed)
[place] for growth today @ 1500, BPP added.  Twice weekly fetal testing beginning next week.

## 2016-05-11 NOTE — Patient Instructions (Signed)
Third Trimester of Pregnancy The third trimester is from week 28 through week 40 (months 7 through 9). The third trimester is a time when the unborn baby (fetus) is growing rapidly. At the end of the ninth month, the fetus is about 20 inches in length and weighs 6-10 pounds. Body changes during your third trimester Your body will continue to go through many changes during pregnancy. The changes vary from woman to woman. During the third trimester:  Your weight will continue to increase. You can expect to gain 25-35 pounds (11-16 kg) by the end of the pregnancy.  You may begin to get stretch marks on your hips, abdomen, and breasts.  You may urinate more often because the fetus is moving lower into your pelvis and pressing on your bladder.  You may develop or continue to have heartburn. This is caused by increased hormones that slow down muscles in the digestive tract.  You may develop or continue to have constipation because increased hormones slow digestion and cause the muscles that push waste through your intestines to relax.  You may develop hemorrhoids. These are swollen veins (varicose veins) in the rectum that can itch or be painful.  You may develop swollen, bulging veins (varicose veins) in your legs.  You may have increased body aches in the pelvis, back, or thighs. This is due to weight gain and increased hormones that are relaxing your joints.  You may have changes in your hair. These can include thickening of your hair, rapid growth, and changes in texture. Some women also have hair loss during or after pregnancy, or hair that feels dry or thin. Your hair will most likely return to normal after your baby is born.  Your breasts will continue to grow and they will continue to become tender. A yellow fluid (colostrum) may leak from your breasts. This is the first milk you are producing for your baby.  Your belly button may stick out.  You may notice more swelling in your hands,  face, or ankles.  You may have increased tingling or numbness in your hands, arms, and legs. The skin on your belly may also feel numb.  You may feel short of breath because of your expanding uterus.  You may have more problems sleeping. This can be caused by the size of your belly, increased need to urinate, and an increase in your body's metabolism.  You may notice the fetus "dropping," or moving lower in your abdomen (lightening).  You may have increased vaginal discharge.  You may notice your joints feel loose and you may have pain around your pelvic bone.  What to expect at prenatal visits You will have prenatal exams every 2 weeks until week 36. Then you will have weekly prenatal exams. During a routine prenatal visit:  You will be weighed to make sure you and the baby are growing normally.  Your blood pressure will be taken.  Your abdomen will be measured to track your baby's growth.  The fetal heartbeat will be listened to.  Any test results from the previous visit will be discussed.  You may have a cervical check near your due date to see if your cervix has softened or thinned (effaced).  You will be tested for Group B streptococcus. This happens between 35 and 37 weeks.  Your health care provider may ask you:  What your birth plan is.  How you are feeling.  If you are feeling the baby move.  If you have had   any abnormal symptoms, such as leaking fluid, bleeding, severe headaches, or abdominal cramping.  If you are using any tobacco products, including cigarettes, chewing tobacco, and electronic cigarettes.  If you have any questions.  Other tests or screenings that may be performed during your third trimester include:  Blood tests that check for low iron levels (anemia).  Fetal testing to check the health, activity level, and growth of the fetus. Testing is done if you have certain medical conditions or if there are problems during the  pregnancy.  Nonstress test (NST). This test checks the health of your baby to make sure there are no signs of problems, such as the baby not getting enough oxygen. During this test, a belt is placed around your belly. The baby is made to move, and its heart rate is monitored during movement.  What is false labor? False labor is a condition in which you feel small, irregular tightenings of the muscles in the womb (contractions) that usually go away with rest, changing position, or drinking water. These are called Braxton Hicks contractions. Contractions may last for hours, days, or even weeks before true labor sets in. If contractions come at regular intervals, become more frequent, increase in intensity, or become painful, you should see your health care provider. What are the signs of labor?  Abdominal cramps.  Regular contractions that start at 10 minutes apart and become stronger and more frequent with time.  Contractions that start on the top of the uterus and spread down to the lower abdomen and back.  Increased pelvic pressure and dull back pain.  A watery or bloody mucus discharge that comes from the vagina.  Leaking of amniotic fluid. This is also known as your "water breaking." It could be a slow trickle or a gush. Let your health care provider know if it has a color or strange odor. If you have any of these signs, call your health care provider right away, even if it is before your due date. Follow these instructions at home: Medicines  Follow your health care provider's instructions regarding medicine use. Specific medicines may be either safe or unsafe to take during pregnancy.  Take a prenatal vitamin that contains at least 600 micrograms (mcg) of folic acid.  If you develop constipation, try taking a stool softener if your health care provider approves. Eating and drinking  Eat a balanced diet that includes fresh fruits and vegetables, whole grains, good sources of protein  such as meat, eggs, or tofu, and low-fat dairy. Your health care provider will help you determine the amount of weight gain that is right for you.  Avoid raw meat and uncooked cheese. These carry germs that can cause birth defects in the baby.  If you have low calcium intake from food, talk to your health care provider about whether you should take a daily calcium supplement.  Eat four or five small meals rather than three large meals a day.  Limit foods that are high in fat and processed sugars, such as fried and sweet foods.  To prevent constipation: ? Drink enough fluid to keep your urine clear or pale yellow. ? Eat foods that are high in fiber, such as fresh fruits and vegetables, whole grains, and beans. Activity  Exercise only as directed by your health care provider. Most women can continue their usual exercise routine during pregnancy. Try to exercise for 30 minutes at least 5 days a week. Stop exercising if you experience uterine contractions.  Avoid heavy   lifting.  Do not exercise in extreme heat or humidity, or at high altitudes.  Wear low-heel, comfortable shoes.  Practice good posture.  You may continue to have sex unless your health care provider tells you otherwise. Relieving pain and discomfort  Take frequent breaks and rest with your legs elevated if you have leg cramps or low back pain.  Take warm sitz baths to soothe any pain or discomfort caused by hemorrhoids. Use hemorrhoid cream if your health care provider approves.  Wear a good support bra to prevent discomfort from breast tenderness.  If you develop varicose veins: ? Wear support pantyhose or compression stockings as told by your healthcare provider. ? Elevate your feet for 15 minutes, 3-4 times a day. Prenatal care  Write down your questions. Take them to your prenatal visits.  Keep all your prenatal visits as told by your health care provider. This is important. Safety  Wear your seat belt at  all times when driving.  Make a list of emergency phone numbers, including numbers for family, friends, the hospital, and police and fire departments. General instructions  Avoid cat litter boxes and soil used by cats. These carry germs that can cause birth defects in the baby. If you have a cat, ask someone to clean the litter box for you.  Do not travel far distances unless it is absolutely necessary and only with the approval of your health care provider.  Do not use hot tubs, steam rooms, or saunas.  Do not drink alcohol.  Do not use any products that contain nicotine or tobacco, such as cigarettes and e-cigarettes. If you need help quitting, ask your health care provider.  Do not use any medicinal herbs or unprescribed drugs. These chemicals affect the formation and growth of the baby.  Do not douche or use tampons or scented sanitary pads.  Do not cross your legs for long periods of time.  To prepare for the arrival of your baby: ? Take prenatal classes to understand, practice, and ask questions about labor and delivery. ? Make a trial run to the hospital. ? Visit the hospital and tour the maternity area. ? Arrange for maternity or paternity leave through employers. ? Arrange for family and friends to take care of pets while you are in the hospital. ? Purchase a rear-facing car seat and make sure you know how to install it in your car. ? Pack your hospital bag. ? Prepare the baby's nursery. Make sure to remove all pillows and stuffed animals from the baby's crib to prevent suffocation.  Visit your dentist if you have not gone during your pregnancy. Use a soft toothbrush to brush your teeth and be gentle when you floss. Contact a health care provider if:  You are unsure if you are in labor or if your water has broken.  You become dizzy.  You have mild pelvic cramps, pelvic pressure, or nagging pain in your abdominal area.  You have lower back pain.  You have persistent  nausea, vomiting, or diarrhea.  You have an unusual or bad smelling vaginal discharge.  You have pain when you urinate. Get help right away if:  Your water breaks before 37 weeks.  You have regular contractions less than 5 minutes apart before 37 weeks.  You have a fever.  You are leaking fluid from your vagina.  You have spotting or bleeding from your vagina.  You have severe abdominal pain or cramping.  You have rapid weight loss or weight gain.    You have shortness of breath with chest pain.  You notice sudden or extreme swelling of your face, hands, ankles, feet, or legs.  Your baby makes fewer than 10 movements in 2 hours.  You have severe headaches that do not go away when you take medicine.  You have vision changes. Summary  The third trimester is from week 28 through week 40, months 7 through 9. The third trimester is a time when the unborn baby (fetus) is growing rapidly.  During the third trimester, your discomfort may increase as you and your baby continue to gain weight. You may have abdominal, leg, and back pain, sleeping problems, and an increased need to urinate.  During the third trimester your breasts will keep growing and they will continue to become tender. A yellow fluid (colostrum) may leak from your breasts. This is the first milk you are producing for your baby.  False labor is a condition in which you feel small, irregular tightenings of the muscles in the womb (contractions) that eventually go away. These are called Braxton Hicks contractions. Contractions may last for hours, days, or even weeks before true labor sets in.  Signs of labor can include: abdominal cramps; regular contractions that start at 10 minutes apart and become stronger and more frequent with time; watery or bloody mucus discharge that comes from the vagina; increased pelvic pressure and dull back pain; and leaking of amniotic fluid. This information is not intended to replace advice  given to you by your health care provider. Make sure you discuss any questions you have with your health care provider. Document Released: 01/05/2001 Document Revised: 06/19/2015 Document Reviewed: 03/14/2012 [employer] Interactive Patient Education  2017 [employer]  

## 2016-05-11 NOTE — Progress Notes (Signed)
   PRENATAL VISIT NOTE  Subjective:  Sue Cooper is a 31 y.o. [phone] at [redacted]w[redacted]d being seen today for ongoing prenatal care.  She is currently monitored for the following issues for this high-risk pregnancy and has History of shoulder dystocia in prior pregnancy, currently pregnant; Obesity in pregnancy, antepartum; BMI 40.0-44.9, adult (HCC); Supervision of high-risk pregnancy; Diabetes mellitus complicating pregnancy, antepartum; and Fetal Arthrogryposis on her problem list.  Patient reports no complaints.  Contractions: Irregular. Vag. Bleeding: None.  Movement: Present. Denies leaking of fluid.   The following portions of the patient's history were reviewed and updated as appropriate: allergies, current medications, past family history, past medical history, past social history, past surgical history and problem list. Problem list updated.  Objective:   Vitals:   05/11/16 1418  BP: 116/67  Pulse: (!) 102  Weight: 268 lb 6.4 oz (121.7 kg)    Fetal Status: Fetal Heart Rate (bpm): NST   Movement: Present     General:  Alert, oriented and cooperative. Patient is in no acute distress.  Skin: Skin is warm and dry. No rash noted.   Cardiovascular: Normal heart rate noted  Respiratory: Normal respiratory effort, no problems with respiration noted  Abdomen: Soft, gravid, appropriate for gestational age. Pain/Pressure: Present     Pelvic:  Cervical exam deferred        Extremities: Normal range of motion.     Mental Status: Normal mood and affect. Normal behavior. Normal judgment and thought content.   Assessment and Plan:  Pregnancy: [id number] at [redacted]w[redacted]d  1. Diabetes mellitus complicating pregnancy, antepartum States control is good - Fetal nonstress test - [place] MFM FETAL BPP WO NON STRESS; Future  2. Supervision of high risk pregnancy in third trimester NST reactive today  Preterm labor symptoms and general obstetric precautions including but not limited to vaginal bleeding,  contractions, leaking of fluid and fetal movement were reviewed in detail with the patient. Please refer to After Visit Summary for other counseling recommendations.  Return in about 7 days (around 05/18/2016) for Ob fu and NST/AFI.   Adam Phenix, MD

## 2016-05-12 ENCOUNTER — Other Ambulatory Visit (HOSPITAL_COMMUNITY): Payer: Self-pay | Admitting: *Deleted

## 2016-05-12 DIAGNOSIS — Z794 Long term (current) use of insulin: Principal | ICD-10-CM

## 2016-05-12 DIAGNOSIS — O24919 Unspecified diabetes mellitus in pregnancy, unspecified trimester: Secondary | ICD-10-CM

## 2016-05-14 ENCOUNTER — Other Ambulatory Visit: Payer: Managed Care, Other (non HMO) | Admitting: Obstetrics & Gynecology

## 2016-05-18 ENCOUNTER — Ambulatory Visit (INDEPENDENT_AMBULATORY_CARE_PROVIDER_SITE_OTHER): Payer: Managed Care, Other (non HMO) | Admitting: Obstetrics and Gynecology

## 2016-05-18 ENCOUNTER — Ambulatory Visit: Payer: Self-pay

## 2016-05-18 VITALS — BP 113/77 | HR 94 | Wt 271.1 lb

## 2016-05-18 DIAGNOSIS — Z23 Encounter for immunization: Secondary | ICD-10-CM

## 2016-05-18 DIAGNOSIS — O24919 Unspecified diabetes mellitus in pregnancy, unspecified trimester: Secondary | ICD-10-CM

## 2016-05-18 DIAGNOSIS — O24913 Unspecified diabetes mellitus in pregnancy, third trimester: Secondary | ICD-10-CM | POA: Diagnosis not present

## 2016-05-18 DIAGNOSIS — O0993 Supervision of high risk pregnancy, unspecified, third trimester: Secondary | ICD-10-CM

## 2016-05-18 LAB — POCT URINALYSIS DIP (DEVICE)
Bilirubin Urine: NEGATIVE
GLUCOSE, UA: NEGATIVE mg/dL
Ketones, ur: 15 mg/dL — AB
Nitrite: NEGATIVE
Protein, ur: 30 mg/dL — AB
Specific Gravity, Urine: 1.025 (ref 1.005–1.030)
UROBILINOGEN UA: 0.2 mg/dL (ref 0.0–1.0)
pH: 6 (ref 5.0–8.0)

## 2016-05-18 NOTE — Progress Notes (Signed)
   PRENATAL VISIT NOTE  Subjective:  Sue Cooper is a 31 y.o. [phone] at [redacted]w[redacted]d being seen today for ongoing prenatal care.  She is currently monitored for the following issues for this high-risk pregnancy and has History of shoulder dystocia in prior pregnancy, currently pregnant; Obesity in pregnancy, antepartum; BMI 40.0-44.9, adult (HCC); Supervision of high-risk pregnancy; Diabetes mellitus complicating pregnancy, antepartum; and Fetal Arthrogryposis on her problem list.  Patient reports no complaints.  Contractions: Irregular. Vag. Bleeding: None.  Movement: Present. Denies leaking of fluid.   The following portions of the patient's history were reviewed and updated as appropriate: allergies, current medications, past family history, past medical history, past social history, past surgical history and problem list. Problem list updated.  Objective:   Vitals:   05/18/16 0911  BP: 113/77  Pulse: 94  Weight: 271 lb 1.6 oz (123 kg)    Fetal Status: Fetal Heart Rate (bpm): NST   Movement: Present  Presentation: Vertex  General:  Alert, oriented and cooperative. Patient is in no acute distress.  Skin: Skin is warm and dry. No rash noted.   Cardiovascular: Normal heart rate noted  Respiratory: Normal respiratory effort, no problems with respiration noted  Abdomen: Soft, gravid, appropriate for gestational age. Pain/Pressure: Present     Pelvic:  Cervical exam deferred        Extremities: Normal range of motion.  Edema: Mild pitting, slight indentation  Mental Status: Normal mood and affect. Normal behavior. Normal judgment and thought content.   Assessment and Plan:  Pregnancy: [id number] at [redacted]w[redacted]d  1. Diabetes mellitus complicating pregnancy, antepartum -Patient taking 33 units long acting - 30 units daily.  -reports sugars at goal, but did not have log today.  - Fetal nonstress test  - 130/mod var/reactive - occasional contractions - US OB Limited  2. Supervision of high risk  pregnancy in third trimester - D/W patient fetal size on last ultrasound. With history of shoulder dystocia and large baby would consider c-section. Fetal arthrogryposis also contributes to decision. Informed patient we would discuss further after next growth scan.  -TDAP today.  Preterm labor symptoms and general obstetric precautions including but not limited to vaginal bleeding, contractions, leaking of fluid and fetal movement were reviewed in detail with the patient. Please refer to After Visit Summary for other counseling recommendations.  Return in about 2 weeks (around 06/01/2016) for NST only;  5/1  NST/AFI only;  5/4  NST only; 5/8 Ob fu and NST/AFI.   Lorne Skeens, MD

## 2016-05-18 NOTE — Patient Instructions (Signed)
Third Trimester of Pregnancy The third trimester is from week 28 through week 40 (months 7 through 9). The third trimester is a time when the unborn baby (fetus) is growing rapidly. At the end of the ninth month, the fetus is about 20 inches in length and weighs 6-10 pounds. Body changes during your third trimester Your body will continue to go through many changes during pregnancy. The changes vary from woman to woman. During the third trimester:  Your weight will continue to increase. You can expect to gain 25-35 pounds (11-16 kg) by the end of the pregnancy.  You may begin to get stretch marks on your hips, abdomen, and breasts.  You may urinate more often because the fetus is moving lower into your pelvis and pressing on your bladder.  You may develop or continue to have heartburn. This is caused by increased hormones that slow down muscles in the digestive tract.  You may develop or continue to have constipation because increased hormones slow digestion and cause the muscles that push waste through your intestines to relax.  You may develop hemorrhoids. These are swollen veins (varicose veins) in the rectum that can itch or be painful.  You may develop swollen, bulging veins (varicose veins) in your legs.  You may have increased body aches in the pelvis, back, or thighs. This is due to weight gain and increased hormones that are relaxing your joints.  You may have changes in your hair. These can include thickening of your hair, rapid growth, and changes in texture. Some women also have hair loss during or after pregnancy, or hair that feels dry or thin. Your hair will most likely return to normal after your baby is born.  Your breasts will continue to grow and they will continue to become tender. A yellow fluid (colostrum) may leak from your breasts. This is the first milk you are producing for your baby.  Your belly button may stick out.  You may notice more swelling in your hands,  face, or ankles.  You may have increased tingling or numbness in your hands, arms, and legs. The skin on your belly may also feel numb.  You may feel short of breath because of your expanding uterus.  You may have more problems sleeping. This can be caused by the size of your belly, increased need to urinate, and an increase in your body's metabolism.  You may notice the fetus "dropping," or moving lower in your abdomen (lightening).  You may have increased vaginal discharge.  You may notice your joints feel loose and you may have pain around your pelvic bone.  What to expect at prenatal visits You will have prenatal exams every 2 weeks until week 36. Then you will have weekly prenatal exams. During a routine prenatal visit:  You will be weighed to make sure you and the baby are growing normally.  Your blood pressure will be taken.  Your abdomen will be measured to track your baby's growth.  The fetal heartbeat will be listened to.  Any test results from the previous visit will be discussed.  You may have a cervical check near your due date to see if your cervix has softened or thinned (effaced).  You will be tested for Group B streptococcus. This happens between 35 and 37 weeks.  Your health care provider may ask you:  What your birth plan is.  How you are feeling.  If you are feeling the baby move.  If you have had   any abnormal symptoms, such as leaking fluid, bleeding, severe headaches, or abdominal cramping.  If you are using any tobacco products, including cigarettes, chewing tobacco, and electronic cigarettes.  If you have any questions.  Other tests or screenings that may be performed during your third trimester include:  Blood tests that check for low iron levels (anemia).  Fetal testing to check the health, activity level, and growth of the fetus. Testing is done if you have certain medical conditions or if there are problems during the  pregnancy.  Nonstress test (NST). This test checks the health of your baby to make sure there are no signs of problems, such as the baby not getting enough oxygen. During this test, a belt is placed around your belly. The baby is made to move, and its heart rate is monitored during movement.  What is false labor? False labor is a condition in which you feel small, irregular tightenings of the muscles in the womb (contractions) that usually go away with rest, changing position, or drinking water. These are called Braxton Hicks contractions. Contractions may last for hours, days, or even weeks before true labor sets in. If contractions come at regular intervals, become more frequent, increase in intensity, or become painful, you should see your health care provider. What are the signs of labor?  Abdominal cramps.  Regular contractions that start at 10 minutes apart and become stronger and more frequent with time.  Contractions that start on the top of the uterus and spread down to the lower abdomen and back.  Increased pelvic pressure and dull back pain.  A watery or bloody mucus discharge that comes from the vagina.  Leaking of amniotic fluid. This is also known as your "water breaking." It could be a slow trickle or a gush. Let your health care provider know if it has a color or strange odor. If you have any of these signs, call your health care provider right away, even if it is before your due date. Follow these instructions at home: Medicines  Follow your health care provider's instructions regarding medicine use. Specific medicines may be either safe or unsafe to take during pregnancy.  Take a prenatal vitamin that contains at least 600 micrograms (mcg) of folic acid.  If you develop constipation, try taking a stool softener if your health care provider approves. Eating and drinking  Eat a balanced diet that includes fresh fruits and vegetables, whole grains, good sources of protein  such as meat, eggs, or tofu, and low-fat dairy. Your health care provider will help you determine the amount of weight gain that is right for you.  Avoid raw meat and uncooked cheese. These carry germs that can cause birth defects in the baby.  If you have low calcium intake from food, talk to your health care provider about whether you should take a daily calcium supplement.  Eat four or five small meals rather than three large meals a day.  Limit foods that are high in fat and processed sugars, such as fried and sweet foods.  To prevent constipation: ? Drink enough fluid to keep your urine clear or pale yellow. ? Eat foods that are high in fiber, such as fresh fruits and vegetables, whole grains, and beans. Activity  Exercise only as directed by your health care provider. Most women can continue their usual exercise routine during pregnancy. Try to exercise for 30 minutes at least 5 days a week. Stop exercising if you experience uterine contractions.  Avoid heavy   lifting.  Do not exercise in extreme heat or humidity, or at high altitudes.  Wear low-heel, comfortable shoes.  Practice good posture.  You may continue to have sex unless your health care provider tells you otherwise. Relieving pain and discomfort  Take frequent breaks and rest with your legs elevated if you have leg cramps or low back pain.  Take warm sitz baths to soothe any pain or discomfort caused by hemorrhoids. Use hemorrhoid cream if your health care provider approves.  Wear a good support bra to prevent discomfort from breast tenderness.  If you develop varicose veins: ? Wear support pantyhose or compression stockings as told by your healthcare provider. ? Elevate your feet for 15 minutes, 3-4 times a day. Prenatal care  Write down your questions. Take them to your prenatal visits.  Keep all your prenatal visits as told by your health care provider. This is important. Safety  Wear your seat belt at  all times when driving.  Make a list of emergency phone numbers, including numbers for family, friends, the hospital, and police and fire departments. General instructions  Avoid cat litter boxes and soil used by cats. These carry germs that can cause birth defects in the baby. If you have a cat, ask someone to clean the litter box for you.  Do not travel far distances unless it is absolutely necessary and only with the approval of your health care provider.  Do not use hot tubs, steam rooms, or saunas.  Do not drink alcohol.  Do not use any products that contain nicotine or tobacco, such as cigarettes and e-cigarettes. If you need help quitting, ask your health care provider.  Do not use any medicinal herbs or unprescribed drugs. These chemicals affect the formation and growth of the baby.  Do not douche or use tampons or scented sanitary pads.  Do not cross your legs for long periods of time.  To prepare for the arrival of your baby: ? Take prenatal classes to understand, practice, and ask questions about labor and delivery. ? Make a trial run to the hospital. ? Visit the hospital and tour the maternity area. ? Arrange for maternity or paternity leave through employers. ? Arrange for family and friends to take care of pets while you are in the hospital. ? Purchase a rear-facing car seat and make sure you know how to install it in your car. ? Pack your hospital bag. ? Prepare the baby's nursery. Make sure to remove all pillows and stuffed animals from the baby's crib to prevent suffocation.  Visit your dentist if you have not gone during your pregnancy. Use a soft toothbrush to brush your teeth and be gentle when you floss. Contact a health care provider if:  You are unsure if you are in labor or if your water has broken.  You become dizzy.  You have mild pelvic cramps, pelvic pressure, or nagging pain in your abdominal area.  You have lower back pain.  You have persistent  nausea, vomiting, or diarrhea.  You have an unusual or bad smelling vaginal discharge.  You have pain when you urinate. Get help right away if:  Your water breaks before 37 weeks.  You have regular contractions less than 5 minutes apart before 37 weeks.  You have a fever.  You are leaking fluid from your vagina.  You have spotting or bleeding from your vagina.  You have severe abdominal pain or cramping.  You have rapid weight loss or weight gain.    You have shortness of breath with chest pain.  You notice sudden or extreme swelling of your face, hands, ankles, feet, or legs.  Your baby makes fewer than 10 movements in 2 hours.  You have severe headaches that do not go away when you take medicine.  You have vision changes. Summary  The third trimester is from week 28 through week 40, months 7 through 9. The third trimester is a time when the unborn baby (fetus) is growing rapidly.  During the third trimester, your discomfort may increase as you and your baby continue to gain weight. You may have abdominal, leg, and back pain, sleeping problems, and an increased need to urinate.  During the third trimester your breasts will keep growing and they will continue to become tender. A yellow fluid (colostrum) may leak from your breasts. This is the first milk you are producing for your baby.  False labor is a condition in which you feel small, irregular tightenings of the muscles in the womb (contractions) that eventually go away. These are called Braxton Hicks contractions. Contractions may last for hours, days, or even weeks before true labor sets in.  Signs of labor can include: abdominal cramps; regular contractions that start at 10 minutes apart and become stronger and more frequent with time; watery or bloody mucus discharge that comes from the vagina; increased pelvic pressure and dull back pain; and leaking of amniotic fluid. This information is not intended to replace advice  given to you by your health care provider. Make sure you discuss any questions you have with your health care provider. Document Released: 01/05/2001 Document Revised: 06/19/2015 Document Reviewed: 03/14/2012 [employer] Interactive Patient Education  2017 [employer]  

## 2016-05-18 NOTE — Progress Notes (Signed)
Pt informed that the ultrasound is considered a limited OB ultrasound and is not intended to be a complete ultrasound exam.  Patient also informed that the ultrasound is not being completed with the intent of assessing for fetal or placental anomalies or any pelvic abnormalities.  Explained that the purpose of today's ultrasound is to assess for presentation and amniotic fluid volume.  Patient acknowledges the purpose of the exam and the limitations of the study.    Pt states she checks her blood sugar 3 times daily.  [place] for growth done 4/17.

## 2016-05-25 ENCOUNTER — Ambulatory Visit: Payer: Self-pay

## 2016-05-25 ENCOUNTER — Other Ambulatory Visit: Payer: Managed Care, Other (non HMO) | Admitting: Certified Nurse Midwife

## 2016-05-25 ENCOUNTER — Ambulatory Visit (INDEPENDENT_AMBULATORY_CARE_PROVIDER_SITE_OTHER): Payer: Managed Care, Other (non HMO) | Admitting: Obstetrics & Gynecology

## 2016-05-25 VITALS — BP 118/62 | HR 102

## 2016-05-25 DIAGNOSIS — O24919 Unspecified diabetes mellitus in pregnancy, unspecified trimester: Secondary | ICD-10-CM

## 2016-05-25 NOTE — Progress Notes (Signed)
NST reviewed and reactive.  Amarilis Belflower L. Harraway-Smith, M.D., FACOG    

## 2016-05-26 ENCOUNTER — Telehealth: Payer: Self-pay | Admitting: *Deleted

## 2016-05-26 NOTE — Telephone Encounter (Addendum)
Pt left message requesting to have a test through bloodwork which will determine the baby's gender because she was told something else earlier in her pregnancy. Please call back.  I returned pt's call and advised that we would not order a test solely for the purpose of determining a baby's gender. She will continue to have weekly ultrasounds as a part of her scheduled fetal testing. Pt was advised that we will continue to try to see the gender during those ultrasounds. That is not possible in all cases and she should be aware of the possibility that she may not know the gender for sure until birth.  Pt voiced understanding and thanked me for returning her call.

## 2016-05-28 ENCOUNTER — Ambulatory Visit (INDEPENDENT_AMBULATORY_CARE_PROVIDER_SITE_OTHER): Payer: Managed Care, Other (non HMO) | Admitting: *Deleted

## 2016-05-28 VITALS — BP 103/62 | HR 97

## 2016-05-28 DIAGNOSIS — O24919 Unspecified diabetes mellitus in pregnancy, unspecified trimester: Secondary | ICD-10-CM

## 2016-05-29 NOTE — Progress Notes (Signed)
NST Note Date: 05/28/2016 Gestational Age: 31/4 FHT: 135 baseline, positive accelerations, negative deceleration, moderate variability Toco: +irritability, rare UCs Time: 25 minutes  A/P: rNST. Continue with current plan of care  Sue Cooper, Jr MD Attending [facility](Faculty Practice)

## 2016-06-01 ENCOUNTER — Ambulatory Visit: Payer: Self-pay

## 2016-06-01 ENCOUNTER — Encounter: Payer: Self-pay | Admitting: Advanced Practice Midwife

## 2016-06-01 ENCOUNTER — Other Ambulatory Visit: Payer: Managed Care, Other (non HMO) | Admitting: Obstetrics & Gynecology

## 2016-06-01 ENCOUNTER — Ambulatory Visit (INDEPENDENT_AMBULATORY_CARE_PROVIDER_SITE_OTHER): Payer: Managed Care, Other (non HMO) | Admitting: Advanced Practice Midwife

## 2016-06-01 VITALS — BP 127/63 | HR 106 | Wt 277.4 lb

## 2016-06-01 DIAGNOSIS — O24919 Unspecified diabetes mellitus in pregnancy, unspecified trimester: Secondary | ICD-10-CM | POA: Diagnosis not present

## 2016-06-01 DIAGNOSIS — O0993 Supervision of high risk pregnancy, unspecified, third trimester: Secondary | ICD-10-CM

## 2016-06-01 LAB — POCT URINALYSIS DIP (DEVICE)
Bilirubin Urine: NEGATIVE
Glucose, UA: 500 mg/dL — AB
HGB URINE DIPSTICK: NEGATIVE
NITRITE: NEGATIVE
PH: 5 (ref 5.0–8.0)
PROTEIN: NEGATIVE mg/dL
Specific Gravity, Urine: 1.02 (ref 1.005–1.030)
UROBILINOGEN UA: 0.2 mg/dL (ref 0.0–1.0)

## 2016-06-01 LAB — GLUCOSE, CAPILLARY: GLUCOSE-CAPILLARY: 122 mg/dL — AB (ref 65–99)

## 2016-06-01 NOTE — Progress Notes (Signed)
   PRENATAL VISIT NOTE  Subjective:  Sue Cooper is a 31 y.o. [id number] at 2267w1d being seen today for ongoing prenatal care.  She is currently monitored for the following issues for this high-risk pregnancy and has History of shoulder dystocia in prior pregnancy, currently pregnant; Obesity in pregnancy, antepartum; BMI 40.0-44.9, adult (HCC); Supervision of high-risk pregnancy; Diabetes mellitus complicating pregnancy, antepartum; and Fetal Arthrogryposis on her problem list.  Patient reports occasional contractions.  Contractions: Irregular. Vag. Bleeding: None.  Movement: Present. Denies leaking of fluid.   The following portions of the patient's history were reviewed and updated as appropriate: allergies, current medications, past family history, past medical history, past social history, past surgical history and problem list. Problem list updated.  Objective:   Vitals:   06/01/16 0843  BP: 127/63  Pulse: (!) 106  Weight: 277 lb 6.4 oz (125.8 kg)    Fetal Status: Fetal Heart Rate (bpm): NST   Movement: Present     General:  Alert, oriented and cooperative. Patient is in no acute distress.  Skin: Skin is warm and dry. No rash noted.   Cardiovascular: Normal heart rate noted  Respiratory: Normal respiratory effort, no problems with respiration noted  Abdomen: Soft, gravid, appropriate for gestational age. Pain/Pressure: Present     Pelvic:  Cervical exam declined        Extremities: Normal range of motion.  Edema: Trace  Mental Status: Normal mood and affect. Normal behavior. Normal judgment and thought content.   Fasting CBGs: 62-120 (<50% out of range) PCB:  110's (30% out of range) PCL:  100-130 (40% out of range) PCD: 100-130 (40% out of range)  Assessment and Plan:  Pregnancy: [id number] at 3767w1d  1. Diabetes mellitus complicating pregnancy, antepartum  - Fetal nonstress test - US OB Limited  2. Supervision of high risk pregnancy in third trimester   Preterm  labor symptoms and general obstetric precautions including but not limited to vaginal bleeding, contractions, leaking of fluid and fetal movement were reviewed in detail with the patient. Please refer to After Visit Summary for other counseling recommendations.  Return in about 7 days (around 06/08/2016) for Ob fu and NST - has US @ 1530.  NST in 4 days   [place], [place], [place]

## 2016-06-01 NOTE — Progress Notes (Signed)
Pt reports having sharp pain pelvic pain.  US for growth scheduled 5/15.

## 2016-06-01 NOTE — Patient Instructions (Signed)
Diabetes Mellitus and Exercise Exercising regularly is important for your overall health, especially when you have diabetes (diabetes mellitus). Exercising is not only about losing weight. It has many health benefits, such as increasing muscle strength and bone density and reducing body fat and stress. This leads to improved fitness, flexibility, and endurance, all of which result in better overall health. Exercise has additional benefits for people with diabetes, including:  Reducing appetite.  Helping to lower and control blood glucose.  Lowering blood pressure.  Helping to control amounts of fatty substances (lipids) in the blood, such as cholesterol and triglycerides.  Helping the body to respond better to insulin (improving insulin sensitivity).  Reducing how much insulin the body needs.  Decreasing the risk for heart disease by:  Lowering cholesterol and triglyceride levels.  Increasing the levels of good cholesterol.  Lowering blood glucose levels. What is my activity plan? Your health care provider or certified diabetes educator can help you make a plan for the type and frequency of exercise (activity plan) that works for you. Make sure that you:  Do at least 150 minutes of moderate-intensity or vigorous-intensity exercise each week. This could be brisk walking, biking, or water aerobics.  Do stretching and strength exercises, such as yoga or weightlifting, at least 2 times a week.  Spread out your activity over at least 3 days of the week.  Get some form of physical activity every day.  Do not go more than 2 days in a row without some kind of physical activity.  Avoid being inactive for more than 90 minutes at a time. Take frequent breaks to walk or stretch.  Choose a type of exercise or activity that you enjoy, and set realistic goals.  Start slowly, and gradually increase the intensity of your exercise over time. What do I need to know about managing my  diabetes?  Check your blood glucose before and after exercising.  If your blood glucose is higher than 240 mg/dL (13.3 mmol/L) before you exercise, check your urine for ketones. If you have ketones in your urine, do not exercise until your blood glucose returns to normal.  Know the symptoms of low blood glucose (hypoglycemia) and how to treat it. Your risk for hypoglycemia increases during and after exercise. Common symptoms of hypoglycemia can include:  Hunger.  Anxiety.  Sweating and feeling clammy.  Confusion.  Dizziness or feeling light-headed.  Increased heart rate or palpitations.  Blurry vision.  Tingling or numbness around the mouth, lips, or tongue.  Tremors or shakes.  Irritability.  Keep a rapid-acting carbohydrate snack available before, during, and after exercise to help prevent or treat hypoglycemia.  Avoid injecting insulin into areas of the body that are going to be exercised. For example, avoid injecting insulin into:  The arms, when playing tennis.  The legs, when jogging.  Keep records of your exercise habits. Doing this can help you and your health care provider adjust your diabetes management plan as needed. Write down:  Food that you eat before and after you exercise.  Blood glucose levels before and after you exercise.  The type and amount of exercise you have done.  When your insulin is expected to peak, if you use insulin. Avoid exercising at times when your insulin is peaking.  When you start a new exercise or activity, work with your health care provider to make sure the activity is safe for you, and to adjust your insulin, medicines, or food intake as needed.  Drink plenty   of water while you exercise to prevent dehydration or heat stroke. Drink enough fluid to keep your urine clear or pale yellow. This information is not intended to replace advice given to you by your health care provider. Make sure you discuss any questions you have with  your health care provider. Document Released: 04/03/2003 Document Revised: 08/01/2015 Document Reviewed: 06/23/2015 Elsevier Interactive Patient Education  2017 [employer]vier Inc.  

## 2016-06-04 ENCOUNTER — Ambulatory Visit (INDEPENDENT_AMBULATORY_CARE_PROVIDER_SITE_OTHER): Payer: Managed Care, Other (non HMO) | Admitting: *Deleted

## 2016-06-04 VITALS — BP 109/72 | HR 105

## 2016-06-04 DIAGNOSIS — O24913 Unspecified diabetes mellitus in pregnancy, third trimester: Secondary | ICD-10-CM | POA: Diagnosis not present

## 2016-06-04 DIAGNOSIS — O24919 Unspecified diabetes mellitus in pregnancy, unspecified trimester: Secondary | ICD-10-CM

## 2016-06-04 NOTE — Progress Notes (Signed)
Pt reports frequent UC's this morning.  3 UC's noted during NST - labor sx reviewed.

## 2016-06-08 ENCOUNTER — Ambulatory Visit (HOSPITAL_COMMUNITY)
Admission: RE | Admit: 2016-06-08 | Discharge: 2016-06-08 | Disposition: A | Discharge status: Home / Self Care | Payer: Managed Care, Other (non HMO) | Source: Ambulatory Visit | Attending: Obstetrics and Gynecology | Admitting: Obstetrics and Gynecology

## 2016-06-08 ENCOUNTER — Encounter (HOSPITAL_COMMUNITY): Payer: Self-pay

## 2016-06-08 ENCOUNTER — Ambulatory Visit (INDEPENDENT_AMBULATORY_CARE_PROVIDER_SITE_OTHER): Payer: Managed Care, Other (non HMO) | Admitting: Obstetrics & Gynecology

## 2016-06-08 ENCOUNTER — Other Ambulatory Visit (HOSPITAL_COMMUNITY)
Admission: RE | Admit: 2016-06-08 | Discharge: 2016-06-08 | Disposition: A | Discharge status: Home / Self Care | Payer: Managed Care, Other (non HMO) | Source: Ambulatory Visit | Attending: Obstetrics & Gynecology | Admitting: Obstetrics & Gynecology

## 2016-06-08 VITALS — BP 110/77 | HR 108 | Wt 278.0 lb

## 2016-06-08 DIAGNOSIS — O24913 Unspecified diabetes mellitus in pregnancy, third trimester: Secondary | ICD-10-CM | POA: Diagnosis not present

## 2016-06-08 DIAGNOSIS — Z3A36 36 weeks gestation of pregnancy: Secondary | ICD-10-CM | POA: Diagnosis not present

## 2016-06-08 DIAGNOSIS — Q688 Other specified congenital musculoskeletal deformities: Secondary | ICD-10-CM

## 2016-06-08 DIAGNOSIS — O0993 Supervision of high risk pregnancy, unspecified, third trimester: Secondary | ICD-10-CM

## 2016-06-08 DIAGNOSIS — Z794 Long term (current) use of insulin: Secondary | ICD-10-CM | POA: Diagnosis not present

## 2016-06-08 DIAGNOSIS — O24919 Unspecified diabetes mellitus in pregnancy, unspecified trimester: Secondary | ICD-10-CM

## 2016-06-08 DIAGNOSIS — O24013 Pre-existing diabetes mellitus, type 1, in pregnancy, third trimester: Secondary | ICD-10-CM | POA: Diagnosis not present

## 2016-06-08 DIAGNOSIS — Z113 Encounter for screening for infections with a predominantly sexual mode of transmission: Secondary | ICD-10-CM

## 2016-06-08 DIAGNOSIS — R69 Illness, unspecified: Secondary | ICD-10-CM | POA: Diagnosis not present

## 2016-06-08 DIAGNOSIS — O99213 Obesity complicating pregnancy, third trimester: Secondary | ICD-10-CM | POA: Diagnosis not present

## 2016-06-08 DIAGNOSIS — O09293 Supervision of pregnancy with other poor reproductive or obstetric history, third trimester: Secondary | ICD-10-CM

## 2016-06-08 DIAGNOSIS — O09299 Supervision of pregnancy with other poor reproductive or obstetric history, unspecified trimester: Secondary | ICD-10-CM

## 2016-06-08 DIAGNOSIS — O358XX0 Maternal care for other (suspected) fetal abnormality and damage, not applicable or unspecified: Secondary | ICD-10-CM | POA: Diagnosis not present

## 2016-06-08 NOTE — Patient Instructions (Signed)
Return to clinic for any scheduled appointments or obstetric concerns, or go to [facility] for evaluation  

## 2016-06-08 NOTE — Progress Notes (Signed)
Pt reports increased swelling of feet and ankles. She also has low back pain. US for growth today @ 1530.

## 2016-06-08 NOTE — Progress Notes (Signed)
PRENATAL VISIT NOTE  Subjective:  Sue Cooper is a 31 y.o. [id number] at [redacted]w[redacted]d being seen today for ongoing prenatal care.  She is currently monitored for the following issues for this high-risk pregnancy and has History of shoulder dystocia in prior pregnancy, currently pregnant; Obesity in pregnancy, antepartum; BMI 40.0-44.9, adult (HCC); Supervision of high-risk pregnancy; Diabetes mellitus complicating pregnancy, antepartum; and Fetal Arthrogryposis on her problem list.  Patient reports no complaints.  Contractions: Irregular. Vag. Bleeding: None.  Movement: Present. Denies leaking of fluid.   The following portions of the patient's history were reviewed and updated as appropriate: allergies, current medications, past family history, past medical history, past social history, past surgical history and problem list. Problem list updated.  Objective:   Vitals:   06/08/16 1509  BP: 110/77  Pulse: (!) 108  Weight: 278 lb (126.1 kg)    Fetal Status: Fetal Heart Rate (bpm): NST   Movement: Present  Presentation: Vertex  General:  Alert, oriented and cooperative. Patient is in no acute distress.  Skin: Skin is warm and dry. No rash noted.   Cardiovascular: Normal heart rate noted  Respiratory: Normal respiratory effort, no problems with respiration noted  Abdomen: Soft, gravid, appropriate for gestational age. Pain/Pressure: Present     Pelvic:  Cervical exam performed Dilation: Closed Effacement (%): Thick Station: Ballotable  Extremities: Normal range of motion.  Edema: Mild pitting, slight indentation  Mental Status: Normal mood and affect. Normal behavior. Normal judgment and thought content.   NST performed today was reviewed and was found to be reactive.  Continue recommended antenatal testing and prenatal care.  US Ob Limited  Result Date: 05/31/2016 AFI normal  US Ob Limited  Result Date: 05/25/2016 Please refer to "Notes" to see consult details.  [place] Mfm Fetal Bpp Wo  Non Stress  Result Date: 05/11/2016 ----------------------------------------------------------------------  OBSTETRICS REPORT                      (Signed Final 05/11/2016 05:33 pm) ---------------------------------------------------------------------- Patient Info  ID #:       [id number]                         D.O.B.:   09/20/1985 (30 yrs)  Name:       Sue Cooper               Visit Date:  05/11/2016 04:22 pm ---------------------------------------------------------------------- Performed By  Performed By:     Vivien Rota        Ref. Address:     [address]                                                             [place], [place]  [address]  Attending:        Particia Nearing MD       Location:         [facility]  Referred By:      Lesly Dukes MD ---------------------------------------------------------------------- Orders   #  Description                                 Code   1  [place] MFM OB FOLLOW UP                         [id number]   2  [place] MFM FETAL BPP WO NON STRESS              76819.01  ----------------------------------------------------------------------   #  Ordered By               Order #        Accession #    Episode #   1  MARK NEWMAN              [id number]      [id number]     [id number]   2  JAMES ARNOLD             [id number]      [id number]     [id number]  ---------------------------------------------------------------------- Indications   [redacted] weeks gestation of pregnancy                Z3A.32   Fetal abnormality - other known or             O35.9XX0   suspected: (arthrogryposis)-declined testing   Pre-existing diabetes, type 2, in pregnancy,   O24.113   third trimester 01/24 A1c = 7.1%; on Lantus   and Novolog; ASA   Obesity complicating pregnancy, third          O99.213   trimester   Poor obstetric history: Prior fetal             O09.299   macrosomia, antepartum with shoulder   dystocia  ---------------------------------------------------------------------- OB History  Blood Type:            Height:  5'3"   Weight (lb):  250      BMI:   44.28  Gravidity:    4         Term:   2        Prem:   0        SAB:   1  TOP:          0       Ectopic:  0        Living: 2 ---------------------------------------------------------------------- Fetal Evaluation  Num Of Fetuses:     1  Fetal Heart         140  Rate(bpm):  Cardiac Activity:   Observed  Presentation:       Cephalic  Placenta:           Anterior, above cervical os  P. Cord Insertion:  Previously Visualized  Amniotic Fluid  AFI FV:      Subjectively within normal limits  AFI Sum(cm)     %Tile       Largest Pocket(cm)  17.84           66  6.25  RUQ(cm)       RLQ(cm)       LUQ(cm)        LLQ(cm)  6.25          5.25          2.05           4.29 ---------------------------------------------------------------------- Biophysical Evaluation  Amniotic F.V:   Pocket => 2 cm two         F. Tone:        Observed                  planes  F. Movement:    Observed                   Score:          8/8  F. Breathing:   Observed ---------------------------------------------------------------------- Biometry  BPD:      81.4  mm     G. Age:  32w 5d         59  %    CI:        72.08   %   70 - 86                                                          FL/HC:      19.0   %   19.1 - 21.3  HC:      305.1  mm     G. Age:  33w 6d         62  %    HC/AC:      0.96       0.96 - 1.17  AC:      318.8  mm     G. Age:  35w 5d       > 97  %    FL/BPD:     71.1   %   71 - 87  FL:       57.9  mm     G. Age:  30w 2d          5  %    FL/AC:      18.2   %   20 - 24  HUM:      54.9  mm     G. Age:  32w 0d         47  %  Est. FW:    2290  gm      5 lb 1 oz     81  % ---------------------------------------------------------------------- Gestational Age  U/S Today:     33w 1d                                         EDD:   06/28/16  Best:          32w 1d    Det. By:   Previous Ultrasound      EDD:   07/05/16                                      (11/22/15) ---------------------------------------------------------------------- Anatomy  Cranium:               Appears normal         Aortic Arch:            Previously seen  Cavum:                 Previously seen        Ductal Arch:            Not well visualized  Ventricles:            Appears normal         Diaphragm:              Previously seen  Choroid Plexus:        Previously seen        Stomach:                Appears normal, left                                                                        sided  Cerebellum:            Previously seen        Abdomen:                Appears normal  Posterior Fossa:       Previously seen        Abdominal Wall:         Previously seen  Nuchal Fold:           Previously seen        Cord Vessels:           Previously seen  Face:                  Orbits and profile     Kidneys:                Appear normal                         previously seen  Lips:                  Previously seen        Bladder:                Appears normal  Thoracic:              Appears normal         Spine:                  Previously seen  Heart:                 Appears normal         Upper Extremities:      Arthrogryposis                         (4CH, axis, and situs  RVOT:                  Previously seen        Lower Extremities:      Clubfeet  LVOT:                  Previously seen  Other:  Gender not well visualized. Technically difficult due to fetal position. ---------------------------------------------------------------------- Cervix Uterus Adnexa  Cervix  Not visualized (advanced GA >29wks)  Uterus  No abnormality visualized.  Left Ovary  Not visualized.  Right Ovary  Not visualized.  Adnexa:       No abnormality visualized. No adnexal mass                visualized. ---------------------------------------------------------------------- Impression   SIUP at 32+1 weeks  Arthrogryposis involving the upper and lower extremities  All other interval fetal anatomy was seen and appeared  normal; anatomic survey complete  Normal amniotic fluid volume  EFW at the 81st %tile, AC > 97th %tile  BPP 8/8 ---------------------------------------------------------------------- Recommendations  Follow-up ultrasound for growth in 4 weeks  Continue antenatal testing ----------------------------------------------------------------------                 Particia Nearing, MD Electronically Signed Final Report   05/11/2016 05:33 pm ----------------------------------------------------------------------  [place] Mfm Ob Follow Up  Result Date: 05/11/2016 ----------------------------------------------------------------------  OBSTETRICS REPORT                      (Signed Final 05/11/2016 05:33 pm) ---------------------------------------------------------------------- Patient Info  ID #:       [id number]                         D.O.B.:   [birth year] (30 yrs)  Name:       Sue Cooper               Visit Date:  05/11/2016 04:22 pm ---------------------------------------------------------------------- Performed By  Performed By:     Vivien Rota        Ref. Address:     [address]                                                             [place], [place]                                                             [address]  Attending:        Particia Nearing MD       Location:         [facility]  Referred By:      Lesly Dukes MD ---------------------------------------------------------------------- Orders   #  Description  Code   1  [place] MFM OB FOLLOW UP                         [id number]   2  [place] MFM FETAL BPP WO NON STRESS              [id number]  ----------------------------------------------------------------------   #  Ordered By                Order #        Accession #    Episode #   1  MARK NEWMAN              [id number]      [id number]     [id number]   2  JAMES ARNOLD             [id number]      [id number]     [id number]  ---------------------------------------------------------------------- Indications   [redacted] weeks gestation of pregnancy                Z3A.32   Fetal abnormality - other known or             O35.9XX0   suspected: (arthrogryposis)-declined testing   Pre-existing diabetes, type 2, in pregnancy,   O24.113   third trimester 01/24 A1c = 7.1%; on Lantus   and Novolog; ASA   Obesity complicating pregnancy, third          O99.213   trimester   Poor obstetric history: Prior fetal            O09.299   macrosomia, antepartum with shoulder   dystocia  ---------------------------------------------------------------------- OB History  Blood Type:            Height:  5'3"   Weight (lb):  250      BMI:   44.28  Gravidity:    4         Term:   2        Prem:   0        SAB:   1  TOP:          0       Ectopic:  0        Living: 2 ---------------------------------------------------------------------- Fetal Evaluation  Num Of Fetuses:     1  Fetal Heart         140  Rate(bpm):  Cardiac Activity:   Observed  Presentation:       Cephalic  Placenta:           Anterior, above cervical os  P. Cord Insertion:  Previously Visualized  Amniotic Fluid  AFI FV:      Subjectively within normal limits  AFI Sum(cm)     %Tile       Largest Pocket(cm)  17.84           66          6.25  RUQ(cm)       RLQ(cm)       LUQ(cm)        LLQ(cm)  6.25          5.25          2.05           4.29 ---------------------------------------------------------------------- Biophysical Evaluation  Amniotic F.V:   Pocket => 2 cm two         F. Tone:        Observed  planes  F. Movement:    Observed                   Score:          8/8  F. Breathing:   Observed ---------------------------------------------------------------------- Biometry  BPD:      81.4  mm     G. Age:  32w 5d          59  %    CI:        72.08   %   70 - 86                                                          FL/HC:      19.0   %   19.1 - 21.3  HC:      305.1  mm     G. Age:  33w 6d         62  %    HC/AC:      0.96       0.96 - 1.17  AC:      318.8  mm     G. Age:  35w 5d       > 97  %    FL/BPD:     71.1   %   71 - 87  FL:       57.9  mm     G. Age:  30w 2d          5  %    FL/AC:      18.2   %   20 - 24  HUM:      54.9  mm     G. Age:  32w 0d         47  %  Est. FW:    2290  gm      5 lb 1 oz     81  % ---------------------------------------------------------------------- Gestational Age  U/S Today:     33w 1d                                        EDD:   06/28/16  Best:          32w 1d    Det. By:   Previous Ultrasound      EDD:   07/05/16                                      (11/22/15) ---------------------------------------------------------------------- Anatomy  Cranium:               Appears normal         Aortic Arch:            Previously seen  Cavum:                 Previously seen        Ductal Arch:            Not well visualized  Ventricles:            Appears normal  Diaphragm:              Previously seen  Choroid Plexus:        Previously seen        Stomach:                Appears normal, left                                                                        sided  Cerebellum:            Previously seen        Abdomen:                Appears normal  Posterior Fossa:       Previously seen        Abdominal Wall:         Previously seen  Nuchal Fold:           Previously seen        Cord Vessels:           Previously seen  Face:                  Orbits and profile     Kidneys:                Appear normal                         previously seen  Lips:                  Previously seen        Bladder:                Appears normal  Thoracic:              Appears normal         Spine:                  Previously seen  Heart:                 Appears normal         Upper Extremities:       Arthrogryposis                         (4CH, axis, and situs  RVOT:                  Previously seen        Lower Extremities:      Clubfeet  LVOT:                  Previously seen  Other:  Gender not well visualized. Technically difficult due to fetal position. ---------------------------------------------------------------------- Cervix Uterus Adnexa  Cervix  Not visualized (advanced GA >29wks)  Uterus  No abnormality visualized.  Left Ovary  Not visualized.  Right Ovary  Not visualized.  Adnexa:       No abnormality visualized. No adnexal mass                visualized. ---------------------------------------------------------------------- Impression  SIUP at 32+1 weeks  Arthrogryposis involving the upper  and lower extremities  All other interval fetal anatomy was seen and appeared  normal; anatomic survey complete  Normal amniotic fluid volume  EFW at the 81st %tile, AC > 97th %tile  BPP 8/8 ---------------------------------------------------------------------- Recommendations  Follow-up ultrasound for growth in 4 weeks  Continue antenatal testing ----------------------------------------------------------------------                 Particia Nearing, MD Electronically Signed Final Report   05/11/2016 05:33 pm ----------------------------------------------------------------------   Assessment and Plan:  Pregnancy: [id number] at [redacted]w[redacted]d 1. Diabetes mellitus complicating pregnancy, antepartum Stable CBGs by patient report.  Did not bring log. Will bring next week. Continue current regimen. - Fetal nonstress test  2. Fetal Arthrogryposis Followed by [facility]  3. History of shoulder dystocia in prior pregnancy, currently pregnant Discuss delivery plan with patient.  4. Supervision of high risk pregnancy in third trimester Pelvic cultures done today. - Strep Gp B NAA - GC/Chlamydia probe amp ([place])not at [facility]  Preterm labor symptoms and general obstetric precautions including but not limited to vaginal  bleeding, contractions, leaking of fluid and fetal movement were reviewed in detail with the patient. Please refer to After Visit Summary for other counseling recommendations.  Return in about 1 week (around 06/15/2016) for Ob fu and NST/AFI.   Tereso Newcomer, MD

## 2016-06-09 LAB — GC/CHLAMYDIA PROBE AMP (~~LOC~~) NOT AT ARMC
CHLAMYDIA, DNA PROBE: NEGATIVE
Neisseria Gonorrhea: NEGATIVE

## 2016-06-10 ENCOUNTER — Encounter: Payer: Self-pay | Admitting: Obstetrics & Gynecology

## 2016-06-10 DIAGNOSIS — O99820 Streptococcus B carrier state complicating pregnancy: Secondary | ICD-10-CM | POA: Insufficient documentation

## 2016-06-10 LAB — STREP GP B NAA: STREP GROUP B AG: POSITIVE — AB

## 2016-06-11 ENCOUNTER — Ambulatory Visit (INDEPENDENT_AMBULATORY_CARE_PROVIDER_SITE_OTHER): Payer: Managed Care, Other (non HMO) | Admitting: Obstetrics & Gynecology

## 2016-06-11 VITALS — BP 111/73 | HR 86

## 2016-06-11 DIAGNOSIS — O24919 Unspecified diabetes mellitus in pregnancy, unspecified trimester: Secondary | ICD-10-CM | POA: Diagnosis not present

## 2016-06-14 ENCOUNTER — Telehealth: Payer: Self-pay | Admitting: *Deleted

## 2016-06-14 NOTE — Telephone Encounter (Signed)
Called pt regarding appt tomorrow @ 1440 and asked if she may be able to come in earlier.  Pt agreed and stated that she would prefer that since she has been having UC's and pain "in my cervix" over the weekend. I asked for more details and she stated that it feels like sharp pain in cervix area about twice per hour. Pt knows it is not active labor however would like her cervix examined at appt tomorrow.  She agreed to appt @ 0840.

## 2016-06-15 ENCOUNTER — Other Ambulatory Visit: Payer: Managed Care, Other (non HMO) | Admitting: Obstetrics and Gynecology

## 2016-06-15 ENCOUNTER — Ambulatory Visit (INDEPENDENT_AMBULATORY_CARE_PROVIDER_SITE_OTHER): Payer: Managed Care, Other (non HMO) | Admitting: Obstetrics and Gynecology

## 2016-06-15 ENCOUNTER — Ambulatory Visit: Payer: Self-pay

## 2016-06-15 VITALS — BP 136/66 | HR 101 | Wt 278.3 lb

## 2016-06-15 DIAGNOSIS — O0993 Supervision of high risk pregnancy, unspecified, third trimester: Secondary | ICD-10-CM

## 2016-06-15 DIAGNOSIS — O24919 Unspecified diabetes mellitus in pregnancy, unspecified trimester: Secondary | ICD-10-CM | POA: Diagnosis not present

## 2016-06-15 DIAGNOSIS — Z029 Encounter for administrative examinations, unspecified: Secondary | ICD-10-CM

## 2016-06-15 MED ORDER — INSULIN ASPART 100 UNIT/ML FLEXPEN: PEN_INJECTOR | SUBCUTANEOUS | 11 refills | Status: DC

## 2016-06-15 NOTE — Progress Notes (Signed)
Pt reports feeling sharp pains in her cervix area as well as some UC's.  She requests cervix exam. Decrease intensity of fetal movement reported. US for growth done 5/15.

## 2016-06-15 NOTE — Progress Notes (Addendum)
   PRENATAL VISIT NOTE  Subjective:  Sue Cooper is a 31 y.o. [phone] at [redacted]w[redacted]d being seen today for ongoing prenatal care.  She is currently monitored for the following issues for this high-risk pregnancy and has History of shoulder dystocia in prior pregnancy, currently pregnant; Obesity in pregnancy, antepartum; BMI 40.0-44.9, adult (HCC); Supervision of high-risk pregnancy; Diabetes mellitus complicating pregnancy, antepartum; Fetal Arthrogryposis; and Group B Streptococcus carrier, +RV culture, currently pregnant on her problem list.  Patient reports occasional contractions.  Contractions: Irregular. Vag. Bleeding: None.  Movement: Present. Denies leaking of fluid.   The following portions of the patient's history were reviewed and updated as appropriate: allergies, current medications, past family history, past medical history, past social history, past surgical history and problem list. Problem list updated.  Objective:   Vitals:   06/15/16 0910  BP: 136/66  Pulse: (!) 101  Weight: 278 lb 4.8 oz (126.2 kg)    Fetal Status: Fetal Heart Rate (bpm): NST   Movement: Present      NST FHT 130/mod var/reactive  Occasional contractions  General:  Alert, oriented and cooperative. Patient is in no acute distress.  Skin: Skin is warm and dry. No rash noted.   Cardiovascular: Normal heart rate noted  Respiratory: Normal respiratory effort, no problems with respiration noted  Abdomen: Soft, gravid, appropriate for gestational age. Pain/Pressure: Present     Pelvic:  Cervical exam performed       0.5/T/H  Extremities: Normal range of motion.  Edema: Mild pitting, slight indentation  Mental Status: Normal mood and affect. Normal behavior. Normal judgment and thought content.   Assessment and Plan:  Pregnancy: [id number] at 770w1d  1. Diabetes mellitus complicating pregnancy, antepartum -reports that her sugars have been out of range with most post prandial sugars > 120 and occasionally  fasting sugars in the 110's.  -increase novolog to 33 unit TID from 30 units TID - keep glargine the same - Fetal nonstress test - US OB Limited - insulin aspart (NOVOLOG) 100 UNIT/ML FlexPen; 33 unit [place]onderosa TID with meal  Dispense: 15 mL; Refill: 11  2. Supervision of high risk pregnancy in third trimester -GBS positive -discussed mode of delivery - patient wished to attempt vaginal delivery, no contraindication at this time. She does have LGA infant with >97% AC, but did deliver a 9lb 5oz baby previously with a shoulder dystocia.  -advised can attempt vaginal delivery, but will have low threshold for failure to progress.  Term labor symptoms and general obstetric precautions including but not limited to vaginal bleeding, contractions, leaking of fluid and fetal movement were reviewed in detail with the patient. Please refer to After Visit Summary for other counseling recommendations.  Return in about 3 days (around 06/18/2016) for 2x/wk as scheduled.   Ernestina PennaNicholas Martell Mcfadyen, MD

## 2016-06-15 NOTE — Patient Instructions (Signed)
Third Trimester of Pregnancy The third trimester is from week 28 through week 40 (months 7 through 9). The third trimester is a time when the unborn baby (fetus) is growing rapidly. At the end of the ninth month, the fetus is about 20 inches in length and weighs 6-10 pounds. Body changes during your third trimester Your body will continue to go through many changes during pregnancy. The changes vary from woman to woman. During the third trimester:  Your weight will continue to increase. You can expect to gain 25-35 pounds (11-16 kg) by the end of the pregnancy.  You may begin to get stretch marks on your hips, abdomen, and breasts.  You may urinate more often because the fetus is moving lower into your pelvis and pressing on your bladder.  You may develop or continue to have heartburn. This is caused by increased hormones that slow down muscles in the digestive tract.  You may develop or continue to have constipation because increased hormones slow digestion and cause the muscles that push waste through your intestines to relax.  You may develop hemorrhoids. These are swollen veins (varicose veins) in the rectum that can itch or be painful.  You may develop swollen, bulging veins (varicose veins) in your legs.  You may have increased body aches in the pelvis, back, or thighs. This is due to weight gain and increased hormones that are relaxing your joints.  You may have changes in your hair. These can include thickening of your hair, rapid growth, and changes in texture. Some women also have hair loss during or after pregnancy, or hair that feels dry or thin. Your hair will most likely return to normal after your baby is born.  Your breasts will continue to grow and they will continue to become tender. A yellow fluid (colostrum) may leak from your breasts. This is the first milk you are producing for your baby.  Your belly button may stick out.  You may notice more swelling in your hands,  face, or ankles.  You may have increased tingling or numbness in your hands, arms, and legs. The skin on your belly may also feel numb.  You may feel short of breath because of your expanding uterus.  You may have more problems sleeping. This can be caused by the size of your belly, increased need to urinate, and an increase in your body's metabolism.  You may notice the fetus "dropping," or moving lower in your abdomen (lightening).  You may have increased vaginal discharge.  You may notice your joints feel loose and you may have pain around your pelvic bone.  What to expect at prenatal visits You will have prenatal exams every 2 weeks until week 36. Then you will have weekly prenatal exams. During a routine prenatal visit:  You will be weighed to make sure you and the baby are growing normally.  Your blood pressure will be taken.  Your abdomen will be measured to track your baby's growth.  The fetal heartbeat will be listened to.  Any test results from the previous visit will be discussed.  You may have a cervical check near your due date to see if your cervix has softened or thinned (effaced).  You will be tested for Group B streptococcus. This happens between 35 and 37 weeks.  Your health care provider may ask you:  What your birth plan is.  How you are feeling.  If you are feeling the baby move.  If you have had   any abnormal symptoms, such as leaking fluid, bleeding, severe headaches, or abdominal cramping.  If you are using any tobacco products, including cigarettes, chewing tobacco, and electronic cigarettes.  If you have any questions.  Other tests or screenings that may be performed during your third trimester include:  Blood tests that check for low iron levels (anemia).  Fetal testing to check the health, activity level, and growth of the fetus. Testing is done if you have certain medical conditions or if there are problems during the  pregnancy.  Nonstress test (NST). This test checks the health of your baby to make sure there are no signs of problems, such as the baby not getting enough oxygen. During this test, a belt is placed around your belly. The baby is made to move, and its heart rate is monitored during movement.  What is false labor? False labor is a condition in which you feel small, irregular tightenings of the muscles in the womb (contractions) that usually go away with rest, changing position, or drinking water. These are called Braxton Hicks contractions. Contractions may last for hours, days, or even weeks before true labor sets in. If contractions come at regular intervals, become more frequent, increase in intensity, or become painful, you should see your health care provider. What are the signs of labor?  Abdominal cramps.  Regular contractions that start at 10 minutes apart and become stronger and more frequent with time.  Contractions that start on the top of the uterus and spread down to the lower abdomen and back.  Increased pelvic pressure and dull back pain.  A watery or bloody mucus discharge that comes from the vagina.  Leaking of amniotic fluid. This is also known as your "water breaking." It could be a slow trickle or a gush. Let your health care provider know if it has a color or strange odor. If you have any of these signs, call your health care provider right away, even if it is before your due date. Follow these instructions at home: Medicines  Follow your health care provider's instructions regarding medicine use. Specific medicines may be either safe or unsafe to take during pregnancy.  Take a prenatal vitamin that contains at least 600 micrograms (mcg) of folic acid.  If you develop constipation, try taking a stool softener if your health care provider approves. Eating and drinking  Eat a balanced diet that includes fresh fruits and vegetables, whole grains, good sources of protein  such as meat, eggs, or tofu, and low-fat dairy. Your health care provider will help you determine the amount of weight gain that is right for you.  Avoid raw meat and uncooked cheese. These carry germs that can cause birth defects in the baby.  If you have low calcium intake from food, talk to your health care provider about whether you should take a daily calcium supplement.  Eat four or five small meals rather than three large meals a day.  Limit foods that are high in fat and processed sugars, such as fried and sweet foods.  To prevent constipation: ? Drink enough fluid to keep your urine clear or pale yellow. ? Eat foods that are high in fiber, such as fresh fruits and vegetables, whole grains, and beans. Activity  Exercise only as directed by your health care provider. Most women can continue their usual exercise routine during pregnancy. Try to exercise for 30 minutes at least 5 days a week. Stop exercising if you experience uterine contractions.  Avoid heavy   lifting.  Do not exercise in extreme heat or humidity, or at high altitudes.  Wear low-heel, comfortable shoes.  Practice good posture.  You may continue to have sex unless your health care provider tells you otherwise. Relieving pain and discomfort  Take frequent breaks and rest with your legs elevated if you have leg cramps or low back pain.  Take warm sitz baths to soothe any pain or discomfort caused by hemorrhoids. Use hemorrhoid cream if your health care provider approves.  Wear a good support bra to prevent discomfort from breast tenderness.  If you develop varicose veins: ? Wear support pantyhose or compression stockings as told by your healthcare provider. ? Elevate your feet for 15 minutes, 3-4 times a day. Prenatal care  Write down your questions. Take them to your prenatal visits.  Keep all your prenatal visits as told by your health care provider. This is important. Safety  Wear your seat belt at  all times when driving.  Make a list of emergency phone numbers, including numbers for family, friends, the hospital, and police and fire departments. General instructions  Avoid cat litter boxes and soil used by cats. These carry germs that can cause birth defects in the baby. If you have a cat, ask someone to clean the litter box for you.  Do not travel far distances unless it is absolutely necessary and only with the approval of your health care provider.  Do not use hot tubs, steam rooms, or saunas.  Do not drink alcohol.  Do not use any products that contain nicotine or tobacco, such as cigarettes and e-cigarettes. If you need help quitting, ask your health care provider.  Do not use any medicinal herbs or unprescribed drugs. These chemicals affect the formation and growth of the baby.  Do not douche or use tampons or scented sanitary pads.  Do not cross your legs for long periods of time.  To prepare for the arrival of your baby: ? Take prenatal classes to understand, practice, and ask questions about labor and delivery. ? Make a trial run to the hospital. ? Visit the hospital and tour the maternity area. ? Arrange for maternity or paternity leave through employers. ? Arrange for family and friends to take care of pets while you are in the hospital. ? Purchase a rear-facing car seat and make sure you know how to install it in your car. ? Pack your hospital bag. ? Prepare the baby's nursery. Make sure to remove all pillows and stuffed animals from the baby's crib to prevent suffocation.  Visit your dentist if you have not gone during your pregnancy. Use a soft toothbrush to brush your teeth and be gentle when you floss. Contact a health care provider if:  You are unsure if you are in labor or if your water has broken.  You become dizzy.  You have mild pelvic cramps, pelvic pressure, or nagging pain in your abdominal area.  You have lower back pain.  You have persistent  nausea, vomiting, or diarrhea.  You have an unusual or bad smelling vaginal discharge.  You have pain when you urinate. Get help right away if:  Your water breaks before 37 weeks.  You have regular contractions less than 5 minutes apart before 37 weeks.  You have a fever.  You are leaking fluid from your vagina.  You have spotting or bleeding from your vagina.  You have severe abdominal pain or cramping.  You have rapid weight loss or weight gain.    You have shortness of breath with chest pain.  You notice sudden or extreme swelling of your face, hands, ankles, feet, or legs.  Your baby makes fewer than 10 movements in 2 hours.  You have severe headaches that do not go away when you take medicine.  You have vision changes. Summary  The third trimester is from week 28 through week 40, months 7 through 9. The third trimester is a time when the unborn baby (fetus) is growing rapidly.  During the third trimester, your discomfort may increase as you and your baby continue to gain weight. You may have abdominal, leg, and back pain, sleeping problems, and an increased need to urinate.  During the third trimester your breasts will keep growing and they will continue to become tender. A yellow fluid (colostrum) may leak from your breasts. This is the first milk you are producing for your baby.  False labor is a condition in which you feel small, irregular tightenings of the muscles in the womb (contractions) that eventually go away. These are called Braxton Hicks contractions. Contractions may last for hours, days, or even weeks before true labor sets in.  Signs of labor can include: abdominal cramps; regular contractions that start at 10 minutes apart and become stronger and more frequent with time; watery or bloody mucus discharge that comes from the vagina; increased pelvic pressure and dull back pain; and leaking of amniotic fluid. This information is not intended to replace advice  given to you by your health care provider. Make sure you discuss any questions you have with your health care provider. Document Released: 01/05/2001 Document Revised: 06/19/2015 Document Reviewed: 03/14/2012 [employer] Interactive Patient Education  2017 [employer]  

## 2016-06-16 ENCOUNTER — Encounter (HOSPITAL_COMMUNITY): Payer: Self-pay

## 2016-06-16 ENCOUNTER — Inpatient Hospital Stay (HOSPITAL_COMMUNITY): Payer: Managed Care, Other (non HMO) | Admitting: Anesthesiology

## 2016-06-16 ENCOUNTER — Inpatient Hospital Stay (HOSPITAL_COMMUNITY)
Admission: AD | Admit: 2016-06-16 | Discharge: 2016-06-18 | DRG: 775 | Disposition: A | Discharge status: Home / Self Care | Payer: Managed Care, Other (non HMO) | Source: Ambulatory Visit | Attending: Family Medicine | Admitting: Family Medicine

## 2016-06-16 DIAGNOSIS — O99214 Obesity complicating childbirth: Secondary | ICD-10-CM | POA: Diagnosis present

## 2016-06-16 DIAGNOSIS — O358XX0 Maternal care for other (suspected) fetal abnormality and damage, not applicable or unspecified: Secondary | ICD-10-CM | POA: Diagnosis present

## 2016-06-16 DIAGNOSIS — Z3490 Encounter for supervision of normal pregnancy, unspecified, unspecified trimester: Secondary | ICD-10-CM

## 2016-06-16 DIAGNOSIS — O24424 Gestational diabetes mellitus in childbirth, insulin controlled: Secondary | ICD-10-CM | POA: Diagnosis not present

## 2016-06-16 DIAGNOSIS — Z3A37 37 weeks gestation of pregnancy: Secondary | ICD-10-CM

## 2016-06-16 DIAGNOSIS — Z6841 Body Mass Index (BMI) 40.0 and over, adult: Secondary | ICD-10-CM | POA: Diagnosis not present

## 2016-06-16 DIAGNOSIS — O24919 Unspecified diabetes mellitus in pregnancy, unspecified trimester: Secondary | ICD-10-CM

## 2016-06-16 DIAGNOSIS — O99824 Streptococcus B carrier state complicating childbirth: Secondary | ICD-10-CM | POA: Diagnosis present

## 2016-06-16 DIAGNOSIS — O24429 Gestational diabetes mellitus in childbirth, unspecified control: Secondary | ICD-10-CM | POA: Diagnosis not present

## 2016-06-16 DIAGNOSIS — O99820 Streptococcus B carrier state complicating pregnancy: Secondary | ICD-10-CM

## 2016-06-16 DIAGNOSIS — O2412 Pre-existing diabetes mellitus, type 2, in childbirth: Secondary | ICD-10-CM | POA: Diagnosis not present

## 2016-06-16 LAB — GLUCOSE, CAPILLARY
GLUCOSE-CAPILLARY: 170 mg/dL — AB (ref 65–99)
Glucose-Capillary: 149 mg/dL — ABNORMAL HIGH (ref 65–99)
Glucose-Capillary: 163 mg/dL — ABNORMAL HIGH (ref 65–99)
Glucose-Capillary: 136 mg/dL — ABNORMAL HIGH (ref 65–99)
Glucose-Capillary: 146 mg/dL — ABNORMAL HIGH (ref 65–99)
Glucose-Capillary: 139 mg/dL — ABNORMAL HIGH (ref 65–99)
Glucose-Capillary: 166 mg/dL — ABNORMAL HIGH (ref 65–99)

## 2016-06-16 LAB — CBC
HCT: 34.8 % — ABNORMAL LOW (ref 36.0–46.0)
Hemoglobin: 12 g/dL (ref 12.0–15.0)
MCH: 29.5 pg (ref 26.0–34.0)
MCHC: 34.5 g/dL (ref 30.0–36.0)
MCV: 85.5 fL (ref 78.0–100.0)
Platelets: 296 10*3/uL (ref 150–400)
RBC: 4.07 MIL/uL (ref 3.87–5.11)
RDW: 13.6 % (ref 11.5–15.5)
WBC: 8.6 10*3/uL (ref 4.0–10.5)

## 2016-06-16 LAB — TYPE AND SCREEN
ABO/RH(D): A POS
Antibody Screen: NEGATIVE

## 2016-06-16 LAB — POCT FERN TEST: POCT Fern Test: POSITIVE

## 2016-06-16 LAB — ABO/RH: ABO/RH(D): A POS

## 2016-06-16 MED ORDER — WITCH HAZEL-GLYCERIN EX PADS: 1.0000 "application " | MEDICATED_PAD | CUTANEOUS | Status: DC | PRN

## 2016-06-16 MED ORDER — SOD CITRATE-CITRIC ACID 500-334 MG/5ML PO SOLN: 30.0000 mL | ORAL | Status: DC | PRN

## 2016-06-16 MED ORDER — EPHEDRINE 5 MG/ML INJ
10.0000 mg | INTRAVENOUS | Status: DC | PRN
  Filled 2016-06-16: qty 2

## 2016-06-16 MED ORDER — PENICILLIN G POT IN DEXTROSE 60000 UNIT/ML IV SOLN
3.0000 10*6.[IU] | INTRAVENOUS | Status: DC
  Administered 2016-06-16: 3 10*6.[IU] via INTRAVENOUS
  Filled 2016-06-16 (×3): qty 50

## 2016-06-16 MED ORDER — OXYTOCIN BOLUS FROM INFUSION
500.0000 mL | Freq: Once | INTRAVENOUS | Status: AC
  Administered 2016-06-16: 500 mL via INTRAVENOUS

## 2016-06-16 MED ORDER — FENTANYL 2.5 MCG/ML BUPIVACAINE 1/10 % EPIDURAL INFUSION (WH - ANES)
INTRAMUSCULAR | Status: AC
  Filled 2016-06-16: qty 100

## 2016-06-16 MED ORDER — INSULIN ASPART 100 UNIT/ML ~~LOC~~ SOLN
15.0000 [IU] | Freq: Three times a day (TID) | SUBCUTANEOUS | Status: DC
  Administered 2016-06-16 – 2016-06-17 (×3): 15 [IU] via SUBCUTANEOUS

## 2016-06-16 MED ORDER — ZOLPIDEM TARTRATE 5 MG PO TABS: 5.0000 mg | ORAL_TABLET | Freq: Every evening | ORAL | Status: DC | PRN

## 2016-06-16 MED ORDER — SIMETHICONE 80 MG PO CHEW: 80.0000 mg | CHEWABLE_TABLET | ORAL | Status: DC | PRN

## 2016-06-16 MED ORDER — PHENYLEPHRINE 40 MCG/ML (10ML) SYRINGE FOR IV PUSH (FOR BLOOD PRESSURE SUPPORT)
80.0000 ug | PREFILLED_SYRINGE | INTRAVENOUS | Status: DC | PRN
  Filled 2016-06-16: qty 5

## 2016-06-16 MED ORDER — DIBUCAINE 1 % RE OINT: 1.0000 "application " | TOPICAL_OINTMENT | RECTAL | Status: DC | PRN

## 2016-06-16 MED ORDER — OXYTOCIN 40 UNITS IN LACTATED RINGERS INFUSION - SIMPLE MED
2.5000 [IU]/h | INTRAVENOUS | Status: DC
  Filled 2016-06-16: qty 1000

## 2016-06-16 MED ORDER — ONDANSETRON HCL 4 MG/2ML IJ SOLN: 4.0000 mg | INTRAMUSCULAR | Status: DC | PRN

## 2016-06-16 MED ORDER — LACTATED RINGERS IV SOLN: 500.0000 mL | Freq: Once | INTRAVENOUS | Status: DC

## 2016-06-16 MED ORDER — SODIUM CHLORIDE 0.9 % IV SOLN
INTRAVENOUS | Status: DC
  Administered 2016-06-16: 0.9 [IU]/h via INTRAVENOUS
  Filled 2016-06-16: qty 1

## 2016-06-16 MED ORDER — DIPHENHYDRAMINE HCL 25 MG PO CAPS: 25.0000 mg | ORAL_CAPSULE | Freq: Four times a day (QID) | ORAL | Status: DC | PRN

## 2016-06-16 MED ORDER — DEXTROSE IN LACTATED RINGERS 5 % IV SOLN
INTRAVENOUS | Status: DC
  Administered 2016-06-16: 13:00:00 via INTRAVENOUS

## 2016-06-16 MED ORDER — ACETAMINOPHEN 325 MG PO TABS: 650.0000 mg | ORAL_TABLET | ORAL | Status: DC | PRN

## 2016-06-16 MED ORDER — BENZOCAINE-MENTHOL 20-0.5 % EX AERO
1.0000 "application " | INHALATION_SPRAY | CUTANEOUS | Status: DC | PRN
  Filled 2016-06-16: qty 56

## 2016-06-16 MED ORDER — FENTANYL 2.5 MCG/ML BUPIVACAINE 1/10 % EPIDURAL INFUSION (WH - ANES)
14.0000 mL/h | INTRAMUSCULAR | Status: DC | PRN
  Administered 2016-06-16: 14 mL/h via EPIDURAL

## 2016-06-16 MED ORDER — OXYCODONE-ACETAMINOPHEN 5-325 MG PO TABS: 2.0000 | ORAL_TABLET | ORAL | Status: DC | PRN

## 2016-06-16 MED ORDER — ONDANSETRON HCL 4 MG/2ML IJ SOLN: 4.0000 mg | Freq: Four times a day (QID) | INTRAMUSCULAR | Status: DC | PRN

## 2016-06-16 MED ORDER — IBUPROFEN 600 MG PO TABS
600.0000 mg | ORAL_TABLET | Freq: Four times a day (QID) | ORAL | Status: DC
  Administered 2016-06-16 – 2016-06-18 (×6): 600 mg via ORAL
  Filled 2016-06-16 (×8): qty 1

## 2016-06-16 MED ORDER — LACTATED RINGERS IV SOLN
500.0000 mL | INTRAVENOUS | Status: DC | PRN
  Administered 2016-06-16: 500 mL via INTRAVENOUS

## 2016-06-16 MED ORDER — INSULIN REGULAR HUMAN 100 UNIT/ML IJ SOLN: INTRAMUSCULAR | Status: DC

## 2016-06-16 MED ORDER — PRENATAL MULTIVITAMIN CH
1.0000 | ORAL_TABLET | Freq: Every day | ORAL | Status: DC
  Administered 2016-06-17: 1 via ORAL
  Filled 2016-06-16: qty 1

## 2016-06-16 MED ORDER — PENICILLIN G POTASSIUM 5000000 UNITS IJ SOLR
5.0000 10*6.[IU] | Freq: Once | INTRAMUSCULAR | Status: AC
  Administered 2016-06-16: 5 10*6.[IU] via INTRAVENOUS
  Filled 2016-06-16: qty 5

## 2016-06-16 MED ORDER — FENTANYL CITRATE (PF) 100 MCG/2ML IJ SOLN
100.0000 ug | INTRAMUSCULAR | Status: DC | PRN
  Administered 2016-06-16: 100 ug via INTRAVENOUS
  Filled 2016-06-16: qty 2

## 2016-06-16 MED ORDER — SENNOSIDES-DOCUSATE SODIUM 8.6-50 MG PO TABS
2.0000 | ORAL_TABLET | ORAL | Status: DC
  Administered 2016-06-17 (×2): 2 via ORAL
  Filled 2016-06-16 (×2): qty 2

## 2016-06-16 MED ORDER — DIPHENHYDRAMINE HCL 50 MG/ML IJ SOLN: 12.5000 mg | INTRAMUSCULAR | Status: DC | PRN

## 2016-06-16 MED ORDER — OXYCODONE-ACETAMINOPHEN 5-325 MG PO TABS: 1.0000 | ORAL_TABLET | ORAL | Status: DC | PRN

## 2016-06-16 MED ORDER — INSULIN GLARGINE 100 UNIT/ML ~~LOC~~ SOLN
15.0000 [IU] | Freq: Every day | SUBCUTANEOUS | Status: DC
  Administered 2016-06-16 – 2016-06-17 (×2): 15 [IU] via SUBCUTANEOUS
  Filled 2016-06-16 (×2): qty 0.15

## 2016-06-16 MED ORDER — LIDOCAINE HCL (PF) 1 % IJ SOLN
30.0000 mL | INTRAMUSCULAR | Status: DC | PRN
  Filled 2016-06-16: qty 30

## 2016-06-16 MED ORDER — PHENYLEPHRINE 40 MCG/ML (10ML) SYRINGE FOR IV PUSH (FOR BLOOD PRESSURE SUPPORT)
PREFILLED_SYRINGE | INTRAVENOUS | Status: AC
  Filled 2016-06-16: qty 10

## 2016-06-16 MED ORDER — TETANUS-DIPHTH-ACELL PERTUSSIS 5-2.5-18.5 LF-MCG/0.5 IM SUSP: 0.5000 mL | Freq: Once | INTRAMUSCULAR | Status: DC

## 2016-06-16 MED ORDER — ACETAMINOPHEN 325 MG PO TABS
650.0000 mg | ORAL_TABLET | ORAL | Status: DC | PRN
  Administered 2016-06-16 – 2016-06-17 (×2): 650 mg via ORAL
  Filled 2016-06-16 (×2): qty 2

## 2016-06-16 MED ORDER — LACTATED RINGERS IV SOLN
INTRAVENOUS | Status: DC
  Administered 2016-06-16: 12:00:00 via INTRAVENOUS

## 2016-06-16 MED ORDER — COCONUT OIL OIL: 1.0000 "application " | TOPICAL_OIL | Status: DC | PRN

## 2016-06-16 MED ORDER — LIDOCAINE HCL (PF) 1 % IJ SOLN
INTRAMUSCULAR | Status: DC | PRN
  Administered 2016-06-16 (×2): 5 mL

## 2016-06-16 MED ORDER — ONDANSETRON HCL 4 MG PO TABS: 4.0000 mg | ORAL_TABLET | ORAL | Status: DC | PRN

## 2016-06-16 MED ORDER — DEXTROSE IN LACTATED RINGERS 5 % IV SOLN: INTRAVENOUS | Status: DC

## 2016-06-16 NOTE — Anesthesia Preprocedure Evaluation (Signed)
Anesthesia Evaluation  Patient identified by MRN, date of birth, ID band Patient awake    Reviewed: Allergy & Precautions, H&P , NPO status , Patient's Chart, lab work & pertinent test results  History of Anesthesia Complications Negative for: history of anesthetic complications  Airway Mallampati: II  TM Distance: >3 FB Neck ROM: full    Dental no notable dental hx. (+) Teeth Intact   Pulmonary neg pulmonary ROS,    Pulmonary exam normal breath sounds clear to auscultation       Cardiovascular negative cardio ROS Normal cardiovascular exam Rhythm:regular Rate:Normal     Neuro/Psych negative neurological ROS  negative psych ROS   GI/Hepatic negative GI ROS, Neg liver ROS,   Endo/Other  diabetesMorbid obesity  Renal/GU negative Renal ROS  negative genitourinary   Musculoskeletal   Abdominal   Peds  Hematology negative hematology ROS (+)   Anesthesia Other Findings   Reproductive/Obstetrics (+) Pregnancy                             Anesthesia Physical Anesthesia Plan  ASA: III  Anesthesia Plan: Epidural   Post-op Pain Management:    Induction:   Airway Management Planned:   Additional Equipment:   Intra-op Plan:   Post-operative Plan:   Informed Consent: I have reviewed the patients History and Physical, chart, labs and discussed the procedure including the risks, benefits and alternatives for the proposed anesthesia with the patient or authorized representative who has indicated his/her understanding and acceptance.     Plan Discussed with:   Anesthesia Plan Comments:         Anesthesia Quick Evaluation

## 2016-06-16 NOTE — Progress Notes (Signed)
Provider called and verified that pt to have CBG and novolog with dinner

## 2016-06-16 NOTE — Anesthesia Pain Management Evaluation Note (Signed)
  CRNA Pain Management Visit Note  Patient: Sue Cooper, 31 y.o., female  "Hello I am a member of the anesthesia team at [facility]. We have an anesthesia team available at all times to provide care throughout the hospital, including epidural management and anesthesia for C-section. I don't know your plan for the delivery whether it a natural birth, water birth, IV sedation, nitrous supplementation, doula or epidural, but we want to meet your pain goals."   1.Was your pain managed to your expectations on prior hospitalizations?   Yes   2.What is your expectation for pain management during this hospitalization?     Epidural  3.How can we help you reach that goal?   Record the patient's initial score and the patient's pain goal.   Pain: 7  Pain Goal: 10 [facility] wants you to be able to say your pain was always managed very well.  Laban EmperorMalinova,Darnisha Vernet Hristova 06/16/2016

## 2016-06-16 NOTE — MAU Note (Signed)
Pt c/o leaking that started at 0900am today. Pt states she started feeling some irregular contractions after the leaking started. Pt states she saw some spotting today before the leaking started. Pt states baby is moving normally.

## 2016-06-16 NOTE — H&P (Signed)
LABOR AND DELIVERY ADMISSION HISTORY AND PHYSICAL NOTE  Sue Cooper is a 31 y.o. female [phone] with IUP at [redacted]w[redacted]d by 7 week ultrasound presenting for SROM and labor. She reports +FM, + contractions, no VB, no blurry vision, headaches or peripheral edema, and RUQ pain.  She plans on breast feeding.   T2DM, poorly controlled Pregnancy is complicated by T2DM and metformin allergy so was treated with Novolog 33u TID with meals and Lantus 33u daily throughout pregnancy.  Last HgbA1c was 7.1 in 01/2016.  Of note patient does have history of large for GA infant and shoulder dystocia in a prior pregnancy. She reports that today she has taken novolog but has not taken lantus yet. She has not checked sugars yet today. She is allergic to metformin.  Fetal arthrogryposis - diagnosed on fetal ultrasound, involving upper and lower extremities.    Prenatal History/Complications:  Past Medical History: Past Medical History:  Diagnosis Date  . 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 Anxiety   . Diabetes mellitus   . Gestational diabetes 08/29/2010  . Headache   . History of shoulder dystocia in prior pregnancy     Past Surgical History: Past Surgical History:  Procedure Laterality Date  . FOOT SURGERY  2007   pin right foot    Obstetrical History: OB History    Gravida Para Term Preterm AB Living   4 2 2  0 1 2   SAB TAB Ectopic Multiple Live Births     1     2      Social History: Social History   Social History  . Marital status: Single    Spouse name: N/A  . Number of children: 1  . Years of education: 76   Occupational History  . lab assist [employer]   Social History Main Topics  . Smoking status: Never Smoker  . Smokeless tobacco: Never Used  . Alcohol use No  . Drug use: No  . Sexual activity: Yes    Partners: Male    Birth control/ protection: None   Other Topics Concern  . None   Social History Narrative  . None    Family  History: Family History  Problem Relation Age of Onset  . Diabetes Mother   . Diabetes Father   . Hypertension Maternal Grandmother     Allergies: Allergies  Allergen Reactions  . Metformin And Related Nausea Only    Prescriptions Prior to Admission  Medication Sig Dispense Refill Last Dose  . acetaminophen (TYLENOL) 500 MG tablet Take 500 mg by mouth every 6 (six) hours as needed for moderate pain.   Past Week at Unknown time  . aspirin EC 81 MG tablet Take 1 tablet (81 mg total) by mouth daily. 30 tablet 7 06/15/2016 at Unknown time  . insulin aspart (NOVOLOG) 100 UNIT/ML FlexPen 33 unit [place] TID with meal (Patient taking differently: Inject 33 Units into the skin 3 (three) times daily with meals. ) 15 mL 11 06/15/2016 at Unknown time  . Insulin Glargine (BASAGLAR KWIKPEN) 100 UNIT/ML SOPN Inject 0.33 mLs (33 Units total) into the skin daily with supper. 33 units with 4-6 pm meal daily 1 pen 1 06/15/2016 at Unknown time  . Prenatal Vit-Fe Fumarate-FA (PNV PRENATAL PLUS MULTIVITAMIN) 27-1 MG TABS Take 1 tablet by mouth daily.  12 06/15/2016 at Unknown time  . sertraline (ZOLOFT) 50 MG tablet Take 50 mg by mouth daily.  1 06/15/2016 at Unknown time  .  ACCU-CHEK SOFTCLIX LANCETS lancets See admin instructions.  0 Taking  . glucose blood (ACCU-CHEK AVIVA PLUS) test strip Use as instructed 100 each 12 Taking  . Lancet Devices (ACCU-CHEK SOFTCLIX) lancets Use as instructed 30 each 11 Taking     Review of Systems   All systems reviewed and negative except as stated in HPI  Physical Exam Blood pressure 104/70, pulse (!) 109, temperature 98.5 F (36.9 C), temperature source Oral, resp. rate 18, height 5\' 3"  (1.6 m), weight 278 lb 4.8 oz (126.2 kg), last menstrual period 09/20/2015, SpO2 97 %. General appearance: alert, cooperative, appears stated age and mild distress Lungs: clear to auscultation bilaterally Heart: regular rate and rhythm Abdomen: soft, non-tender; bowel sounds  normal Extremities: No calf swelling or tenderness  Fetal monitoring: 120 bmp, moderate variability, accels, +variable decels noted Uterine activity: contractions q4H Dilation: 4 Effacement (%): 80 Station: -2 Exam by:: christy white,rnc   Prenatal labs: ABO, Rh: A/POS/-- (12/04 1036) Antibody: NEG (12/04 1036) Rubella: immune RPR: Non Reactive (03/19 0917)  HBsAg: NEGATIVE (12/04 1036)  HIV: Non Reactive (03/19 0917)  GBS: Positive (05/15 1547)  Genetic screening:  negative Anatomy US: arthrogryposis involving upper and lower extremities  Prenatal Transfer Tool  Maternal Diabetes: GDM B Genetic Screening: Normal Maternal Ultrasounds/Referrals: Normal Fetal Ultrasounds or other Referrals:  Other:  arthrogryposis Maternal Substance Abuse:  No Significant Maternal Medications:  Meds include: Other:  Novolog, Lantus Significant Maternal Lab Results: Lab values include: Group B Strep positive  Results for orders placed or performed during the hospital encounter of 06/16/16 (from the past 24 hour(s))  POCT fern test   Collection Time: 06/16/16 10:03 AM  Result Value Ref Range   POCT Fern Test Positive = ruptured amniotic membanes   CBC   Collection Time: 06/16/16 10:30 AM  Result Value Ref Range   WBC 8.6 4.0 - 10.5 K/uL   RBC 4.07 3.87 - 5.11 MIL/uL   Hemoglobin 12.0 12.0 - 15.0 g/dL   HCT 40.934.8 (L) 81.136.0 - 91.446.0 %   MCV 85.5 78.0 - 100.0 fL   MCH 29.5 26.0 - 34.0 pg   MCHC 34.5 30.0 - 36.0 g/dL   RDW 78.213.6 95.611.5 - 21.315.5 %   Platelets 296 150 - 400 K/uL    Patient Active Problem List   Diagnosis Date Noted  . Pregnancy 06/16/2016  . Group B Streptococcus carrier, +RV culture, currently pregnant 06/10/2016  . Fetal Arthrogryposis 02/18/2016  . Supervision of high-risk pregnancy 12/15/2015  . Diabetes mellitus complicating pregnancy, antepartum 12/15/2015  . BMI 40.0-44.9, adult (HCC) 01/07/2013  . Obesity in pregnancy, antepartum 01/06/2013  . History of shoulder  dystocia in prior pregnancy, currently pregnant 08/29/2010    Assessment: Sue Cooper is a 31 y.o. [id number] at 6262w2d here for SROM and labor  1. Insulin-dependent T2DM - check CBG - likely will require  insulin gtt/D5 protocol/q2H CBG monitoring  2. Presentation in labor with SROM - Labor: consider cytotec - Pain: Cautious IV pain control given variable decels - FWB: Category II tracing - ID:  GBS+ - PCN - MOF: breast - ONG:[id number]:undecided - Circ:  yes  3. Fetal arthrogryposis -  fetus with contractures in upper and lower extremities. Patient desires vaginal delivery. She does have a history of shoulder dystocia and LGA in prior delivery resolved with suprapubic pressure.  - Counseled about risks for vaginal delivery including risk of injury to baby's arm with delivery in the case of shoulder dystocia due to arm contractures,  though dystocia is less likely with smaller than previous fetus - patient verbalizes understanding of risks and agrees to proceed with vaginal delivery  Howard Pouch, MD PGY-1 [facility]  06/16/2016, 11:40 AM  OB FELLOW HISTORY AND PHYSICAL ATTESTATION  I confirm that I have verified the information documented in the resident's note and that I have also personally reperformed the physical exam and all medical decision making activities.      Ernestina Penna 06/16/2016, 12:36 PM

## 2016-06-16 NOTE — Anesthesia Procedure Notes (Signed)
Epidural Patient location during procedure: OB  Staffing Anesthesiologist: Mercadez Heitman Performed: anesthesiologist   Preanesthetic Checklist Completed: patient identified, site marked, surgical consent, pre-op evaluation, timeout performed, IV checked, risks and benefits discussed and monitors and equipment checked  Epidural Patient position: sitting Prep: DuraPrep Patient monitoring: heart rate, continuous pulse ox and blood pressure Approach: right paramedian Location: L3-L4 Injection technique: LOR saline  Needle:  Needle type: Tuohy  Needle gauge: 17 G Needle length: 9 cm and 9 Needle insertion depth: 7 cm Catheter type: closed end flexible Catheter size: 20 Guage Catheter at skin depth: 11 cm Test dose: negative  Assessment Events: blood not aspirated, injection not painful, no injection resistance, negative IV test and no paresthesia  Additional Notes Patient identified. Risks/Benefits/Options discussed with patient including but not limited to bleeding, infection, nerve damage, paralysis, failed block, incomplete pain control, headache, blood pressure changes, nausea, vomiting, reactions to medication both or allergic, itching and postpartum back pain. Confirmed with bedside nurse the patient's most recent platelet count. Confirmed with patient that they are not currently taking any anticoagulation, have any bleeding history or any family history of bleeding disorders. Patient expressed understanding and wished to proceed. All questions were answered. Sterile technique was used throughout the entire procedure. Please see nursing notes for vital signs. Test dose was given through epidural needle and negative prior to continuing to dose epidural or start infusion. Warning signs of high block given to the patient including shortness of breath, tingling/numbness in hands, complete motor block, or any concerning symptoms with instructions to call for help. Patient was given  instructions on fall risk and not to get out of bed. All questions and concerns addressed with instructions to call with any issues.     

## 2016-06-17 DIAGNOSIS — Z3A37 37 weeks gestation of pregnancy: Secondary | ICD-10-CM

## 2016-06-17 DIAGNOSIS — O99824 Streptococcus B carrier state complicating childbirth: Secondary | ICD-10-CM

## 2016-06-17 DIAGNOSIS — O2412 Pre-existing diabetes mellitus, type 2, in childbirth: Secondary | ICD-10-CM

## 2016-06-17 LAB — RPR: RPR: NONREACTIVE

## 2016-06-17 LAB — GLUCOSE, CAPILLARY
Glucose-Capillary: 122 mg/dL — ABNORMAL HIGH (ref 65–99)
Glucose-Capillary: 101 mg/dL — ABNORMAL HIGH (ref 65–99)

## 2016-06-17 MED ORDER — OXYCODONE HCL 5 MG PO TABS
5.0000 mg | ORAL_TABLET | Freq: Four times a day (QID) | ORAL | Status: DC | PRN
  Administered 2016-06-17: 5 mg via ORAL
  Filled 2016-06-17: qty 1

## 2016-06-17 NOTE — Progress Notes (Signed)
Inpatient Diabetes Program Recommendations  AACE/ADA: New Consensus Statement on Inpatient Glycemic Control (2015)  Target Ranges:  Prepandial:   less than 140 mg/dL      Peak postprandial:   less than 180 mg/dL (1-2 hours)      Critically ill patients:  140 - 180 mg/dL   Results for Sue PiccoloMARTIN, [name] (MRN [id number]) as of 06/17/2016 07:50  Ref. Range 06/16/2016 11:56 06/16/2016 12:57 06/16/2016 13:54 06/16/2016 14:58 06/16/2016 15:54 06/16/2016 20:53 06/16/2016 22:42  Glucose-Capillary Latest Ref Range: 65 - 99 mg/dL 562170 (H) 130149 (H) 865163 (H) 136 (H) 146 (H) 139 (H) 166 (H)   Review of Glycemic Control  Diabetes history: DM2 Outpatient Diabetes medications: Basaglar 33 units with supper, Novolog 33 units TID with meals Current orders for Inpatient glycemic control: Lantus 15 units QHS, Novolog 15 units TID with measl  Inpatient Diabetes Program Recommendations:  Correction (SSI): While inpatient, please consider using the Glycemic Control order set and ordering CBGs and Novolog 0-9 units TID with meals and Novolog 0-5 units QHS (in addition to Novolog correction scale).  Thanks, Orlando PennerMarie Arienna Benegas, RN, MSN, CDE Diabetes Coordinator Inpatient Diabetes Program [phone] (Team Pager from 8am to 5pm)

## 2016-06-17 NOTE — Anesthesia Postprocedure Evaluation (Signed)
Anesthesia Post Note  Patient: Sue Cooper  Procedure(s) Performed: * No procedures listed *  Patient location during evaluation: Mother Baby Anesthesia Type: Epidural Level of consciousness: awake and alert and oriented Pain management: satisfactory to patient Vital Signs Assessment: post-procedure vital signs reviewed and stable Respiratory status: spontaneous breathing and nonlabored ventilation Cardiovascular status: stable Postop Assessment: no headache, no backache, no signs of nausea or vomiting, adequate PO intake and patient able to bend at knees (patient up walking) Anesthetic complications: no        Last Vitals:  Vitals:   06/16/16 2006 06/17/16 0011  BP: 136/60 122/75  Pulse: 77 74  Resp: 16 16  Temp: 37.1 C 36.6 C    Last Pain:  Vitals:   06/17/16 0520  TempSrc:   PainSc: 2    Pain Goal: Patients Stated Pain Goal: 0 (06/16/16 2134)               Madison HickmanGREGORY,Hennie Gosa

## 2016-06-17 NOTE — Progress Notes (Signed)
Post Partum Day #1 Subjective: no complaints, up ad lib, voiding, tolerating PO and reports normal lochia  Objective: Blood pressure 122/75, pulse 74, temperature 97.9 F (36.6 C), temperature source Oral, resp. rate 16, height 5\' 3"  (1.6 m), weight 126.2 kg (278 lb 4.8 oz), last menstrual period 09/20/2015, SpO2 98 %, unknown if currently breastfeeding.  Physical Exam:  General: alert Lochia: appropriate Uterine Fundus: firm DVT Evaluation: No evidence of DVT seen on physical exam.   Recent Labs  06/16/16 1030  HGB 12.0  HCT 34.8*    Assessment/Plan: Plan for discharge tomorrow  Baby in the NICU due to sugars   LOS: 1 day   Allie BossierMyra C Meleni Delahunt 06/17/2016, 6:53 AM

## 2016-06-17 NOTE — Lactation Note (Signed)
This note was copied from a baby's chart. Lactation Consultation Note Baby bld sugar 35. RN asked LC to assist in helping mom latch. RN putting baby STS. Baby wouldn't latch. Hand expression taught, collected 22m. Suck training attempted. Gave colostrum via foley cup. NICU DR. Came in to talk w/mom the need of baby transferring to NICU d/t low sugars and needing monitored.  Gave mom yellow dots and numbered. Asked RN to get milk labels. Discussed pumping, milk storage, cleaning pump kit. Extra bullets and bottles given.  Discussed supply and demand, needing to pump every 3 hrs.  Mom calm about baby going to NICU.  Patient Name: Sue Yatziri MMcreeTNXGZF'PDate: 06/17/2016 Reason for consult: Follow-up assessment   Maternal Data    Feeding Feeding Type: Breast Milk  LATCH Score/Interventions Latch: Too sleepy or reluctant, no latch achieved, no sucking elicited. Intervention(s): Skin to skin;Teach feeding cues;Waking techniques Intervention(s): Adjust position;Breast massage;Breast compression  Audible Swallowing: None Intervention(s): Hand expression;Skin to skin  Type of Nipple: Everted at rest and after stimulation  Comfort (Breast/Nipple): Soft / non-tender     Hold (Positioning): Full assist, staff holds infant at breast Intervention(s): Breastfeeding basics reviewed;Support Pillows;Position options;Skin to skin  LATCH Score: 4  Lactation Tools Discussed/Used Tools: Pump Breast pump type: Double-Electric Breast Pump   Consult Status Consult Status: Follow-up Date: 06/17/16 Follow-up type: In-patient    CTheodoro Kalata5/24/2018, 4:50 AM

## 2016-06-17 NOTE — Lactation Note (Signed)
This note was copied from a baby's chart. Lactation Consultation Note Mom sleeping soundly, FOB says she's exhausted and needs to rest. FIB holding baby. RN notified LC baby's Bld sugar low. Mom used DEBP w/nothing noted. Unable to hand express for colostrum d/t sleeping. Gave formula in foley cup. Baby tolerated well. D/t baby had 2 low bld sugars, received glucose gels X2 and formula once, LC gave 20 ML formula trying to get blood sugar up. Will talk w/mom when she wakes. Mom is breast/formula. Didn't BF her 1st child, only BF her 2nd child 2 weeks per RN.    Patient Name: Sue Cooper'VToday's Date: 06/17/2016 Reason for consult: Initial assessment;Other (Comment) (hypoglycemia)   Maternal Data Formula Feeding for Exclusion: Yes Reason for exclusion: Mother's choice to formula and breast feed on admission  Feeding Feeding Type: Formula  LATCH Score/Interventions                      Lactation Tools Discussed/Used Pump Review: Setup, frequency, and cleaning;Milk Storage Initiated by:: PBrownRN Date initiated:: 06/16/16   Consult Status Consult Status: Follow-up Date: 06/17/16 Follow-up type: In-patient    Cresencia Asmus, Diamond NickelLAURA G 06/17/2016, 1:50 AM

## 2016-06-17 NOTE — Progress Notes (Signed)
I went in to do vitals and fundal assessment on Patient and patient pulled covers over her head and did not want to be bothered. I told her to call out today when she eats to give her her insulin . She stated she would. Will let her rest per patient request.

## 2016-06-17 NOTE — Lactation Note (Signed)
This note was copied from a baby's chart. Lactation Consultation Note  Patient Name: Boy Torry Daphine DeutscherMartin ZOXWR'UToday's Date: 06/17/2016 Reason for consult: Follow-up assessment;NICU baby  NICU baby 2822 hour old. FOB reports that mom in the shower and intending to pump after she eats. Enc FOB to have mom call for assistance as needed.   Maternal Data    Feeding    LATCH Score/Interventions                      Lactation Tools Discussed/Used     Consult Status Consult Status: Follow-up Date: 06/18/16 Follow-up type: In-patient    Sherlyn HayJennifer D Coralynn Gaona 06/17/2016, 4:05 PM

## 2016-06-18 ENCOUNTER — Other Ambulatory Visit: Payer: Managed Care, Other (non HMO) | Admitting: Obstetrics and Gynecology

## 2016-06-18 LAB — GLUCOSE, CAPILLARY
GLUCOSE-CAPILLARY: 82 mg/dL (ref 65–99)
Glucose-Capillary: 64 mg/dL — ABNORMAL LOW (ref 65–99)

## 2016-06-18 MED ORDER — INSULIN ASPART 100 UNIT/ML FLEXPEN: PEN_INJECTOR | SUBCUTANEOUS | 11 refills | Status: DC

## 2016-06-18 MED ORDER — IBUPROFEN 600 MG PO TABS: 600.0000 mg | ORAL_TABLET | Freq: Four times a day (QID) | ORAL | 0 refills | Status: AC

## 2016-06-18 NOTE — Discharge Summary (Addendum)
OB Discharge Summary  Patient Name: Sue Cooper DOB: [birth year] MRN: [id number]  Date of admission: 06/16/2016 Delivering MD: Lorne Skeens   Date of discharge: 06/18/2016  Admitting diagnosis: WATER BROKE Intrauterine pregnancy: [redacted]w[redacted]d     Secondary diagnosis:Active Problems:   Pregnancy  Additional problems: morbid obesity, Class B DM (on insulin), + GBS     Discharge diagnosis: Term Pregnancy Delivered                                                                      Complications: None  Hospital course:  Onset of Labor With Vaginal Delivery     31 y.o. yo [phone] at [redacted]w[redacted]d was admitted in Active Labor on 06/16/2016. Patient had an uncomplicated labor course as follows:  Membrane Rupture Time/Date: 9:00 AM ,06/16/2016   Intrapartum Procedures: Episiotomy: None [1]                                         Lacerations:  None [1]  Patient had a delivery of a Viable infant. 06/16/2016  Information for the patient's newborn:  Nihira, Puello Joshlyn [[id number]]  Delivery Method: Vag-Spont    Pateint had an uncomplicated postpartum course.  She is ambulating, tolerating a regular diet, passing flatus, and urinating well. Patient is discharged home in stable condition on 06/18/16.   Physical exam  Vitals:   06/16/16 2006 06/17/16 0011 06/17/16 1042 06/18/16 0549  BP:  122/75 130/74 (!) 125/53  Pulse: 77 74 78 79  Resp: 16 16 18 16   Temp: 98.8 F (37.1 C) 97.9 F (36.6 C) 98.4 F (36.9 C) 98.1 F (36.7 C)  TempSrc: Oral Oral Oral Oral  SpO2: 100% 98%    Weight:      Height:       General: alert Lochia: appropriate Uterine Fundus: firm and NT at U-1 Incision: N/A DVT Evaluation: No evidence of DVT seen on physical exam. Labs: Lab Results  Component Value Date   WBC 8.6 06/16/2016   HGB 12.0 06/16/2016   HCT 34.8 (L) 06/16/2016   MCV 85.5 06/16/2016   PLT 296 06/16/2016   CMP Latest Ref Rng & Units 12/17/2015  Glucose 65 - 99 mg/dL 130(Q)  BUN  7 - 25 mg/dL 11  Creatinine 6.57 - 8.46 mg/dL 9.62  Sodium 952 - 841 mmol/L 136  Potassium 3.5 - 5.3 mmol/L 3.9  Chloride 98 - 110 mmol/L 102  CO2 20 - 31 mmol/L 26  Calcium 8.6 - 10.2 mg/dL 9.1  Total Protein 6.1 - 8.1 g/dL 6.5  Total Bilirubin 0.2 - 1.2 mg/dL 0.4  Alkaline Phos 33 - 115 U/L 53  AST 10 - 30 U/L 14  ALT 6 - 29 U/L 13    Discharge instruction: per After Visit Summary and "Baby and Me Booklet".  After Visit Meds:  Allergies as of 06/18/2016      Reactions   Metformin And Related Nausea Only      Medication List    STOP taking these medications   aspirin EC 81 MG tablet   BASAGLAR KWIKPEN 100 UNIT/ML Sopn  TAKE these medications   accu-chek softclix lancets Use as instructed   ACCU-CHEK SOFTCLIX LANCETS lancets See admin instructions.   acetaminophen 500 MG tablet Commonly known as:  TYLENOL Take 500 mg by mouth every 6 (six) hours as needed for moderate pain.   glucose blood test strip Commonly known as:  ACCU-CHEK AVIVA PLUS Use as instructed   ibuprofen 600 MG tablet Commonly known as:  ADVIL,MOTRIN Take 1 tablet (600 mg total) by mouth every 6 (six) hours.   insulin aspart 100 UNIT/ML FlexPen Commonly known as:  NOVOLOG 15unit [place] TID with meal What changed:  additional instructions   PNV PRENATAL PLUS MULTIVITAMIN 27-1 MG Tabs Take 1 tablet by mouth daily.   sertraline 50 MG tablet Commonly known as:  ZOLOFT Take 50 mg by mouth daily.      She tells me that she checks her sugars 3 times per day at home and generally manages her own sugars. She saw an endocrinologist 2 years ago reportedly but she doesn't remember who. She tells me that she will see her Primary Care MD, Rocky LinkVivian Sunn in the next month regarding her diabetes.  Diet: carb modified diet  Activity: Advance as tolerated. Pelvic rest for 6 weeks.   Outpatient follow up:4 weeks Follow up Appt:No future appointments. Follow up visit: No Follow-up on file.  Postpartum  contraception: Undecided  Newborn Data: Live born female  Birth Weight: 8 lb 4.3 oz (3750 g) APGAR: 8, 9  Baby Feeding: pumping Disposition:NICU due to sugars   06/18/2016 Allie BossierMyra C Joeli Fenner, MD

## 2016-06-18 NOTE — Lactation Note (Signed)
This note was copied from a baby's chart. Lactation Consultation Note  Patient Name: Sue Cooper HIPRK'S Date: 06/18/2016 Reason for consult: Follow-up assessment;NICU baby  NICU baby 39 hours old. Mom reports that she has not been pumping every 2-3 hours for a total of 8 times/24 hours followed by hand expression, but intends to do so now that she is going home. Discussed the importance of pumping as the baby would be nursing--while separated from the baby--in order to promote and maintain a good breast milk supply. Enc mom to take pumping kit and mom aware of pumping rooms in NICU. Mom rented a DEBP, and states that she has call her insurance company and her personal pump has been ordered. Mom aware of OP/BFSG and [place] phone line assistance after D/C.   Maternal Data    Feeding    LATCH Score/Interventions                      Lactation Tools Discussed/Used     Consult Status Consult Status: PRN    Andres Labrum 06/18/2016, 10:02 AM

## 2016-06-18 NOTE — Discharge Instructions (Signed)
Contraception Choices °Contraception, also called birth control, means things to use or ways to try not to get pregnant. °Hormonal birth control °This kind of birth control uses hormones. Here are some types of hormonal birth control: °· A tube that is put under skin of the arm (implant). The tube can stay in for as long as 3 years. °· Shots to get every 3 months (injections). °· Pills to take every day (birth control pills). °· A patch to change 1 time each week for 3 weeks (birth control patch). After that, the patch is taken off for 1 week. °· A ring to put in the vagina. The ring is left in for 3 weeks. Then it is taken out of the vagina for 1 week. Then a new ring is put in. °· Pills to take after unprotected sex (emergency birth control pills). ° °Barrier birth control °Here are some types of barrier birth control: °· A thin covering that is put on the penis before sex (female condom). The covering is thrown away after sex. °· A soft, loose covering that is put in the vagina before sex (female condom). The covering is thrown away after sex. °· A rubber bowl that sits over the cervix (diaphragm). The bowl must be made for you. The bowl is put into the vagina before sex. The bowl is left in for 6-8 hours after sex. It is taken out within 24 hours. °· A small, soft cup that fits over the cervix (cervical cap). The cup must be made for you. The cup can be left in for 6-8 hours after sex. It is taken out within 48 hours. °· A sponge that is put into the vagina before sex. It must be left in for at least 6 hours after sex. It must be taken out within 30 hours. Then it is thrown away. °· A chemical that kills or stops sperm from getting into the uterus (spermicide). It may be a pill, cream, jelly, or foam to put in the vagina. The chemical should be used at least 10-15 minutes before sex. ° °IUD (intrauterine) birth control °An IUD is a small, T-shaped piece of plastic. It is put inside the uterus. There are two  kinds: °· Hormone IUD. This kind can stay in for 3-5 years. °· Copper IUD. This kind can stay in for 10 years. ° °Permanent birth control °Here are some types of permanent birth control: °· Surgery to block the fallopian tubes. °· Having an insert put into each fallopian tube. °· Surgery to tie off the tubes that carry sperm (vasectomy). ° °Natural planning birth control °Here are some types of natural planning birth control: °· Not having sex on the days the woman could get pregnant. °· Using a calendar: °? To keep track of the length of each period. °? To find out what days pregnancy can happen. °? To plan to not have sex on days when pregnancy can happen. °· Watching for symptoms of ovulation and not having sex during ovulation. One way the woman can check for ovulation is to check her temperature. °· Waiting to have sex until after ovulation. ° °Summary °· Contraception, also called birth control, means things to use or ways to try not to get pregnant. °· Hormonal methods of birth control include implants, injections, pills, patches, vaginal rings, and emergency birth control pills. °· Barrier methods of birth control can include female condoms, female condoms, diaphragms, cervical caps, sponges, and spermicides. °· There are two types of   IUD (intrauterine device) birth control. An IUD can be put in a woman's uterus to prevent pregnancy for 3-5 years. °· Permanent sterilization can be done through a procedure for males, females, or both. °· Natural planning methods involve not having sex on the days when the woman could get pregnant. °This information is not intended to replace advice given to you by your health care provider. Make sure you discuss any questions you have with your health care provider. °Document Released: 11/08/2008 Document Revised: 01/22/2016 Document Reviewed: 01/22/2016 °Elsevier Interactive Patient Education © 2017 Elsevier Inc[employer] °Home Care Instructions for Mom °ACTIVITY °· Gradually return to  your regular activities. °· Let yourself rest. Nap while your baby sleeps. °· Avoid lifting anything that is heavier than 10 lb (4.5 kg) until your health care provider says it is okay. °· Avoid activities that take a lot of effort and energy (are strenuous) until approved by your health care provider. Walking at a slow-to-moderate pace is usually safe. °· If you had a cesarean delivery: °? Do not vacuum, climb stairs, or drive a car for 4-6 weeks. °? Have someone help you at home until you feel like you can do your usual activities yourself. °? Do exercises as told by your health care provider, if this applies. ° °VAGINAL BLEEDING °You may continue to bleed for 4-6 weeks after delivery. Over time, the amount of blood usually decreases and the color of the blood usually gets lighter. However, the flow of bright red blood may increase if you have been too active. If you need to use more than one pad in an hour because your pad gets soaked, or if you pass a large clot: °· Lie down. °· Raise your feet. °· Place a cold compress on your lower abdomen. °· Rest. °· Call your health care provider. ° °If you are breastfeeding, your period should return anytime between 8 weeks after delivery and the time that you stop breastfeeding. If you are not breastfeeding, your period should return 6-8 weeks after delivery. °PERINEAL CARE °The perineal area, or perineum, is the part of your body between your thighs. After delivery, this area needs special care. Follow these instructions as told by your health care provider. °· Take warm tub baths for 15-20 minutes. °· Use medicated pads and pain-relieving sprays and creams as told. °· Do not use tampons or douches until vaginal bleeding has stopped. °· Each time you go to the bathroom: °? Use a peri bottle. °? Change your pad. °? Use towelettes in place of toilet paper until your stitches have healed. °· Do Kegel exercises every day. Kegel exercises help to maintain the muscles that  support the vagina, bladder, and bowels. You can do these exercises while you are standing, sitting, or lying down. To do Kegel exercises: °? Tighten the muscles of your abdomen and the muscles that surround your birth canal. °? Hold for a few seconds. °? Relax. °? Repeat until you have done this 5 times in a row. °· To prevent hemorrhoids from developing or getting worse: °? Drink enough fluid to keep your urine clear or pale yellow. °? Avoid straining when having a bowel movement. °? Take over-the-counter medicines and stool softeners as told by your health care provider. ° °BREAST CARE °· Wear a tight-fitting bra. °· Avoid taking over-the-counter pain medicine for breast discomfort. °· Apply ice to the breasts to help with discomfort as needed: °? Put ice in a plastic bag. °? Place a towel between your   skin and the bag. °? Leave the ice on for 20 minutes or as told by your health care provider. ° °NUTRITION °· Eat a well-balanced diet. °· Do not try to lose weight quickly by cutting back on calories. °· Take your prenatal vitamins until your postpartum checkup or until your health care provider tells you to stop. ° °POSTPARTUM DEPRESSION °You may find yourself crying for no apparent reason and unable to cope with all of the changes that come with having a newborn. This mood is called postpartum depression. Postpartum depression happens because your hormone levels change after delivery. If you have postpartum depression, get support from your partner, friends, and family. If the depression does not go away on its own after several weeks, contact your health care provider. °BREAST SELF-EXAM °Do a breast self-exam each month, at the same time of the month. If you are breastfeeding, check your breasts just after a feeding, when your breasts are less full. If you are breastfeeding and your period has started, check your breasts on day 5, 6, or 7 of your period. °Report any lumps, bumps, or discharge to your health  care provider. Know that breasts are normally lumpy if you are breastfeeding. This is temporary, and it is not a health risk. °INTIMACY AND SEXUALITY °Avoid sexual activity for at least 3-4 weeks after delivery or until the brownish-red vaginal flow is completely gone. If you want to avoid pregnancy, use some form of birth control. You can get pregnant after delivery, even if you have not had your period. °SEEK MEDICAL CARE IF: °· You feel unable to cope with the changes that a child brings to your life, and these feelings do not go away after several weeks. °· You notice a lump, a bump, or discharge on your breast. ° °SEEK IMMEDIATE MEDICAL CARE IF: °· Blood soaks your pad in 1 hour or less. °· You have: °? Severe pain or cramping in your lower abdomen. °? A bad-smelling vaginal discharge. °? A fever that is not controlled by medicine. °? A fever, and an area of your breast is red and sore. °? Pain or redness in your calf. °? Sudden, severe chest pain. °? Shortness of breath. °? Painful or bloody urination. °? Problems with your vision. °· You vomit for 12 hours or longer. °· You develop a severe headache. °· You have serious thoughts about hurting yourself, your child, or anyone else. ° °This information is not intended to replace advice given to you by your health care provider. Make sure you discuss any questions you have with your health care provider. °Document Released: 01/09/2000 Document Revised: 06/19/2015 Document Reviewed: 07/15/2014 °Elsevier Interactive Patient Education © 2017 Elsevier Inc. ° °

## 2016-06-19 ENCOUNTER — Ambulatory Visit: Payer: Self-pay

## 2016-06-19 NOTE — Lactation Note (Signed)
This note was copied from a baby's chart. Lactation Consultation Note  Patient Name: Sue Eline Daphine DeutscherMartin BJYNW'GToday's Date: 06/19/2016 Reason for consult: Follow-up assessment;NICU baby   Follow up with 72 hour NICU infant for feeding assistance. Assisted mom in positioning infant to left breast in the football hold. Infant has a fractured femur so infant was swaddled during feeding. Infant opened mouth a few times and would moan when pulled toward breast. Infant went to sleep pretty quickly. Breast milk easy to express from the left breast.   Mom reports she has not pumped since yesterday. Reviewed supply and demand and milk coming to volume with mom. She has a [employer] DEBP at home for use. Mom's breasts are soft with colostrum easily expressible, mom report the last time she pump she obtained 10 cc. She reports she will start pumping. Enc mom to pump every 2-3 hours/at least 8 x a day for 15-20 minutes.   Discussed with mom that his behavior is normal for an early term infant. Mom and dad without further questions/concerns at this time. Enc mom to call for assistance as needed.    Maternal Data Has patient been taught Hand Expression?: Yes Does the patient have breastfeeding experience prior to this delivery?: Yes  Feeding Feeding Type: Breast Fed Nipple Type: Slow - flow Length of feed: 30 min  LATCH Score/Interventions Latch: Too sleepy or reluctant, no latch achieved, no sucking elicited. Intervention(s): Skin to skin;Teach feeding cues;Waking techniques Intervention(s): Adjust position;Assist with latch;Breast massage;Breast compression  Audible Swallowing: None Intervention(s): Hand expression;Skin to skin  Type of Nipple: Everted at rest and after stimulation  Comfort (Breast/Nipple): Soft / non-tender     Hold (Positioning): Assistance needed to correctly position infant at breast and maintain latch. Intervention(s): Breastfeeding basics reviewed;Support Pillows;Position  options;Skin to skin  LATCH Score: 5  Lactation Tools Discussed/Used     Consult Status Consult Status: PRN Follow-up type: Call as needed    Ed BlalockSharon S Parrie Rasco 06/19/2016, 5:38 PM

## 2016-06-22 ENCOUNTER — Other Ambulatory Visit: Payer: Managed Care, Other (non HMO) | Admitting: Family Medicine

## 2016-06-25 ENCOUNTER — Other Ambulatory Visit: Payer: Managed Care, Other (non HMO) | Admitting: Family Medicine

## 2016-07-08 ENCOUNTER — Telehealth: Payer: Self-pay | Admitting: *Deleted

## 2016-07-08 NOTE — Telephone Encounter (Signed)
Message left on voice mail by Sindy Guadeloupeammy Suggs, [facility].  She stated that she had been to see pt today for post partum home visit (delivered on 5/23) and has concerns for pt's BP value - 140/86.  Pt denies H/A and visual disturbances. Tammy just wanted our office to be aware of pt's BP. I reviewed pt's EMR then called pt @ 1630 and discussed the concern. Pt has never had elevated BP per chart review. Pt again denied H/A or visual changes or dizziness. I advised pt to come to office tomorrow for BP check and she agreed. She will arrive between 0800-1100.

## 2016-07-09 ENCOUNTER — Ambulatory Visit: Payer: Managed Care, Other (non HMO)

## 2016-07-14 NOTE — Progress Notes (Signed)
Addendum: 07/14/16 10:22 am FMLA forms completed.

## 2016-07-23 ENCOUNTER — Encounter: Payer: Self-pay | Admitting: Family Medicine

## 2016-07-29 ENCOUNTER — Encounter: Payer: Self-pay | Admitting: Student

## 2016-07-29 ENCOUNTER — Encounter: Payer: Self-pay | Admitting: General Practice

## 2016-07-29 ENCOUNTER — Other Ambulatory Visit (HOSPITAL_COMMUNITY)
Admission: RE | Admit: 2016-07-29 | Discharge: 2016-07-29 | Disposition: A | Discharge status: Home / Self Care | Payer: Managed Care, Other (non HMO) | Source: Ambulatory Visit | Attending: Student | Admitting: Student

## 2016-07-29 ENCOUNTER — Ambulatory Visit (INDEPENDENT_AMBULATORY_CARE_PROVIDER_SITE_OTHER): Payer: Managed Care, Other (non HMO) | Admitting: Student

## 2016-07-29 DIAGNOSIS — B373 Candidiasis of vulva and vagina: Secondary | ICD-10-CM

## 2016-07-29 DIAGNOSIS — N898 Other specified noninflammatory disorders of vagina: Secondary | ICD-10-CM

## 2016-07-29 DIAGNOSIS — B3731 Acute candidiasis of vulva and vagina: Secondary | ICD-10-CM

## 2016-07-29 MED ORDER — FLUCONAZOLE 150 MG PO TABS: 150.0000 mg | ORAL_TABLET | Freq: Once | ORAL | 0 refills | Status: AC

## 2016-07-29 NOTE — Progress Notes (Addendum)
Subjective:     Sue Cooper is a 31 y.o. female who presents for a postpartum visit. She is 6 weeks postpartum following a spontaneous vaginal delivery. I have fully reviewed the prenatal and intrapartum course. The delivery was at 37.2 gestational weeks. Outcome: spontaneous vaginal delivery. Anesthesia: epidural. Postpartum course has been uneventful. Baby's course has been uneventful. Baby is feeding by bottle - Enfacare. Bleeding no bleeding. Bowel function is normal. Bladder function is normal. Patient is sexually active. Contraception method is none. Postpartum depression screening: negative.  The following portions of the patient's history were reviewed and updated as appropriate: allergies, current medications, past family history, past medical history, past social history, past surgical history and problem list.  Review of Systems Pertinent items are noted in HPI.   Objective:    BP 123/80   Pulse 90   Ht 5\' 3"  (1.6 m)   Wt 242 lb 11.2 oz (110.1 kg)   BMI 42.99 kg/m   General:  alert and cooperative   Breasts:  inspection negative, no nipple discharge or bleeding, no masses or nodularity palpable  Lungs: clear to auscultation bilaterally  Heart:  regular rate and rhythm, S1, S2 normal, no murmur, click, rub or gallop  Abdomen: soft, non-tender; bowel sounds normal; no masses,  no organomegaly   Vulva:  normal  Vagina: vagina positive for white clumpy discharge  Cervix:  no CMT  Corpus: not examined  Adnexa:  normal adnexa  Rectal Exam: Normal rectovaginal exam        Assessment:    Healthy postpartum exam. Pap smear not done at today's visit.   Plan:    1. Contraception: none 2. Plan for Pap smear in one year  3. RX for Diflucan sent to pharmacy; wet prep pending.  3. Follow up in: 1 year or as needed; patient to follow up with her endocrinologist Dr. Valarie MerinoSunn in July.

## 2016-07-29 NOTE — Patient Instructions (Signed)

## 2016-07-30 DIAGNOSIS — B3731 Acute candidiasis of vulva and vagina: Secondary | ICD-10-CM | POA: Insufficient documentation

## 2016-07-30 DIAGNOSIS — B373 Candidiasis of vulva and vagina: Secondary | ICD-10-CM | POA: Insufficient documentation

## 2016-07-30 DIAGNOSIS — N898 Other specified noninflammatory disorders of vagina: Secondary | ICD-10-CM | POA: Insufficient documentation

## 2016-08-02 LAB — CERVICOVAGINAL ANCILLARY ONLY
BACTERIAL VAGINITIS: NEGATIVE
CANDIDA VAGINITIS: POSITIVE — AB
Chlamydia: NEGATIVE
Neisseria Gonorrhea: NEGATIVE
Trichomonas: NEGATIVE

## 2016-11-06 DIAGNOSIS — N3000 Acute cystitis without hematuria: Secondary | ICD-10-CM | POA: Diagnosis not present

## 2016-12-15 DIAGNOSIS — R69 Illness, unspecified: Secondary | ICD-10-CM | POA: Diagnosis not present

## 2016-12-29 IMAGING — US US MFM FETAL NUCHAL TRANSLUCENCY
1 series · 15 of 28 positions shown · non-contrast
Comparison: none

[Series 1: us mfm fetal nuchal translucency · 46 acquisitions, 15 frames shown]
[im 1/46]
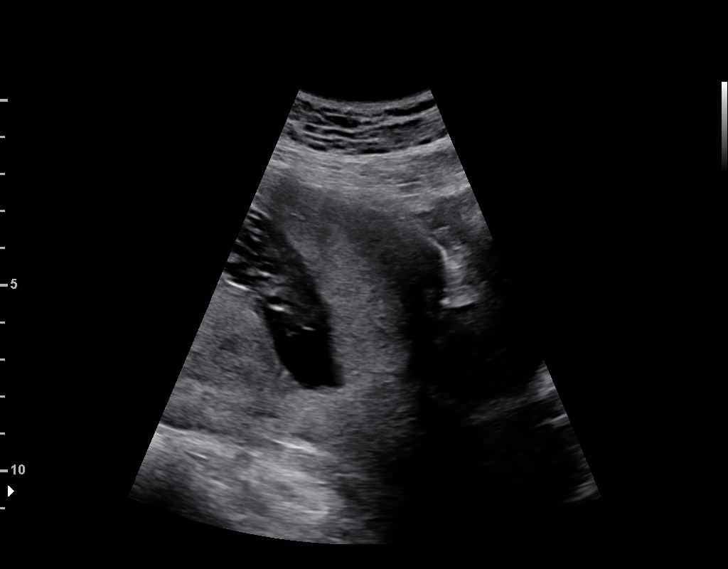
[im 4/46]
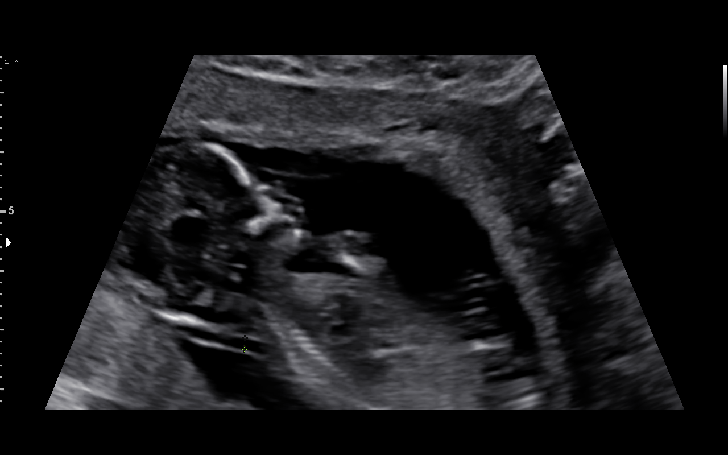
[im 7/46]
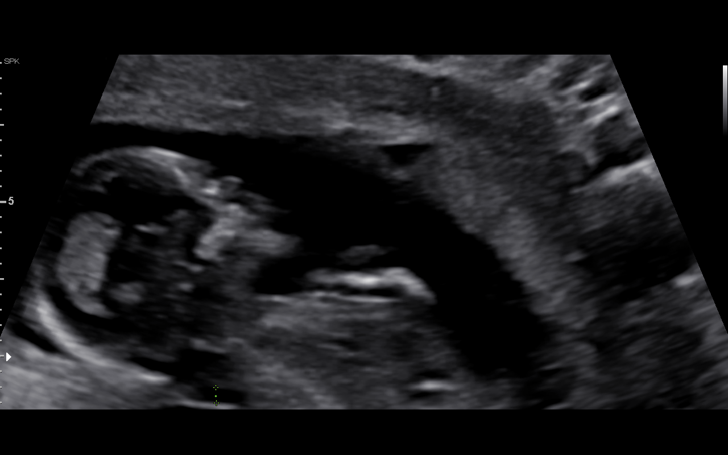
[im 11/46]
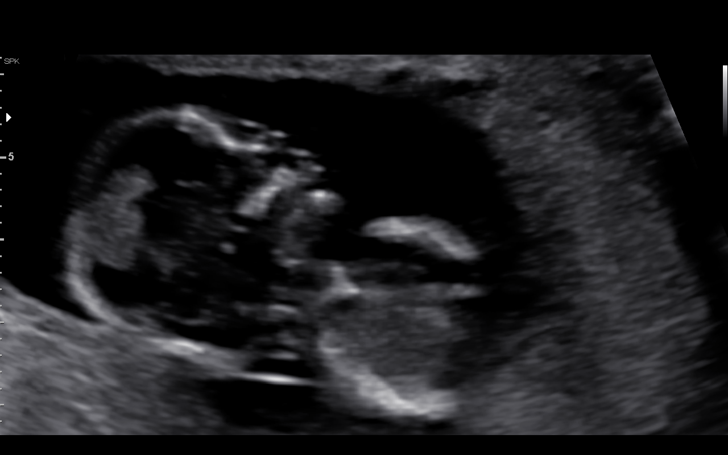
[im 14/46]
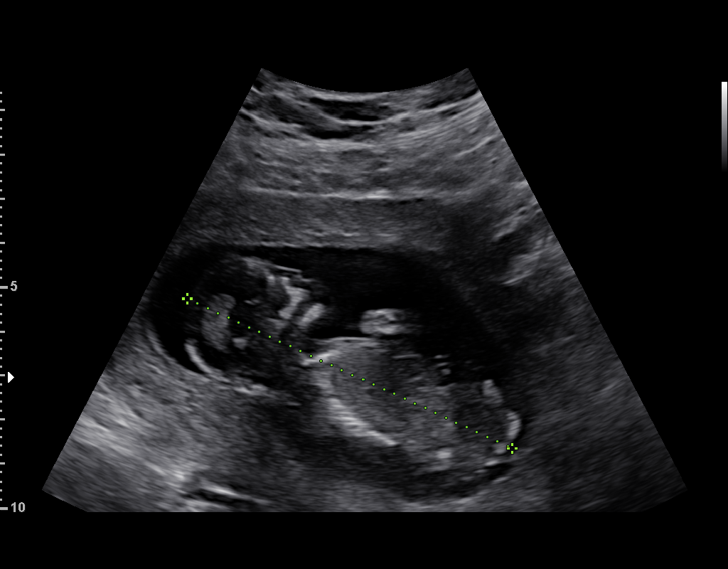
[im 17/46]
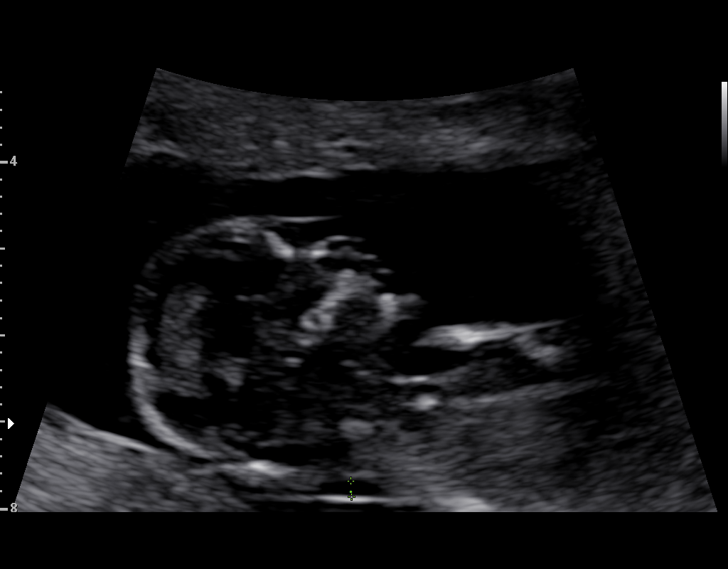
[im 21/46]
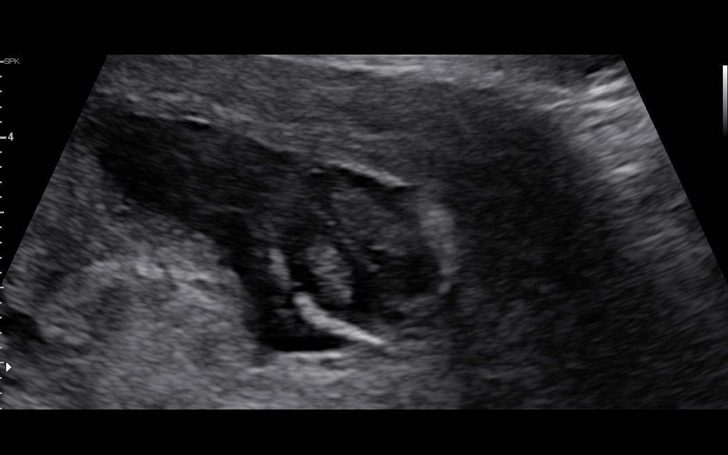
[im 24/46]
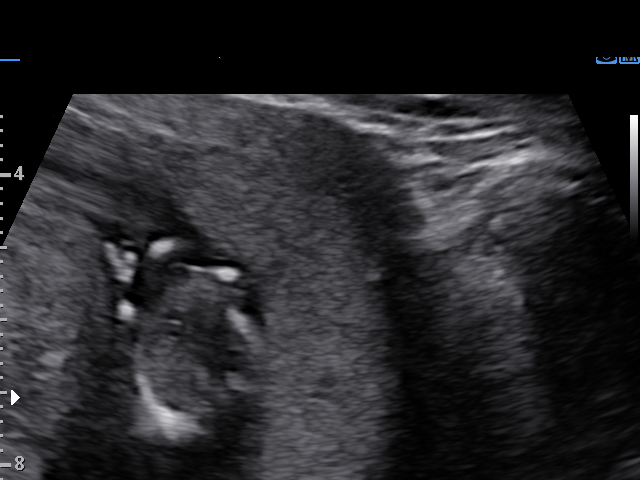
[im 26/46]
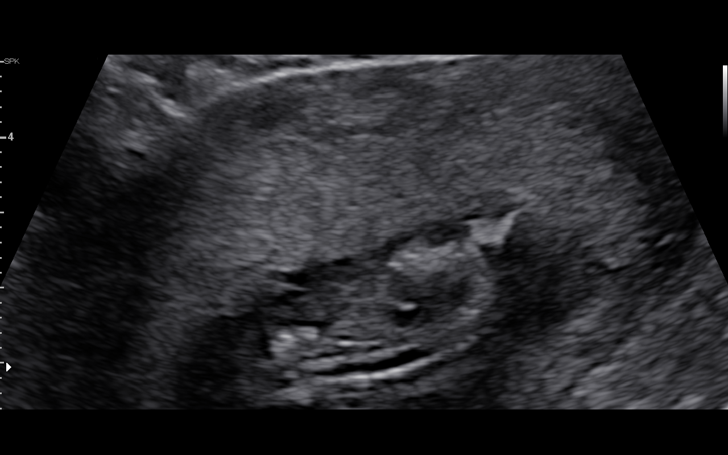
[im 29/46]
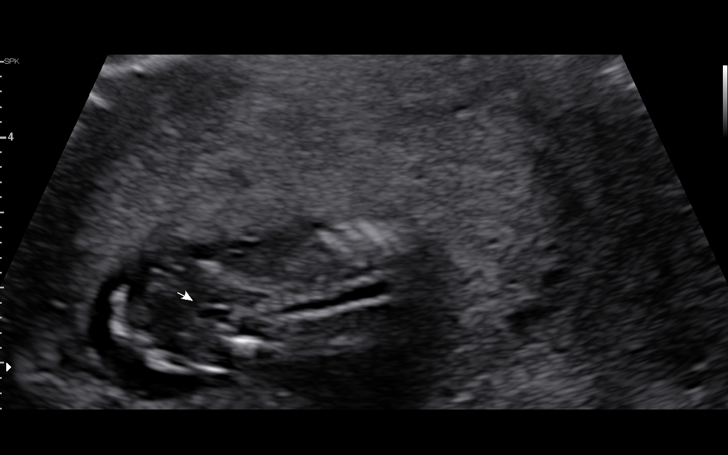
[im 32/46]
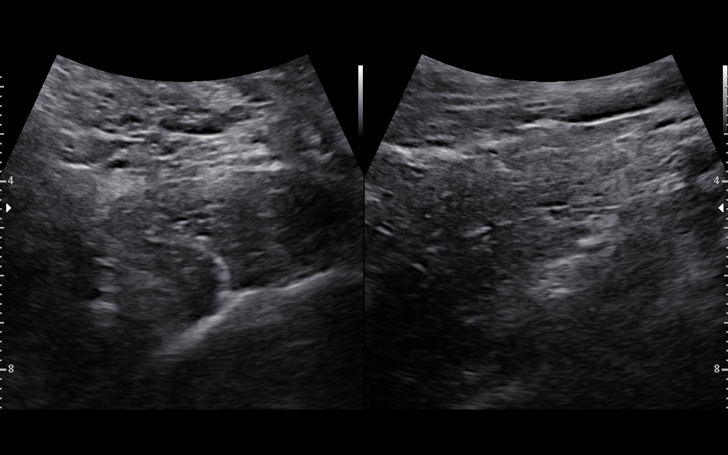
[im 36/46]
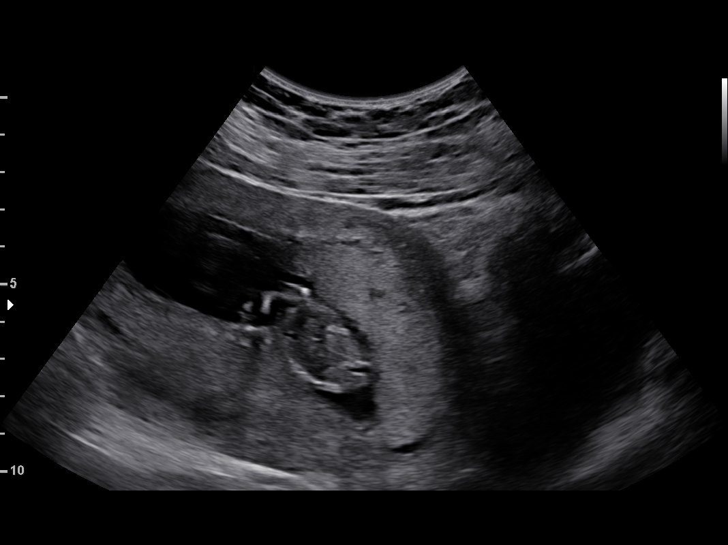
[im 39/46]
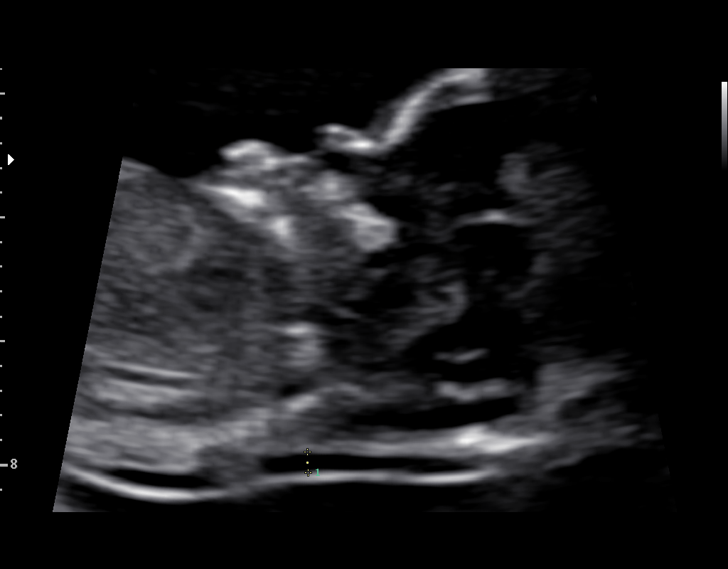
[im 42/46]
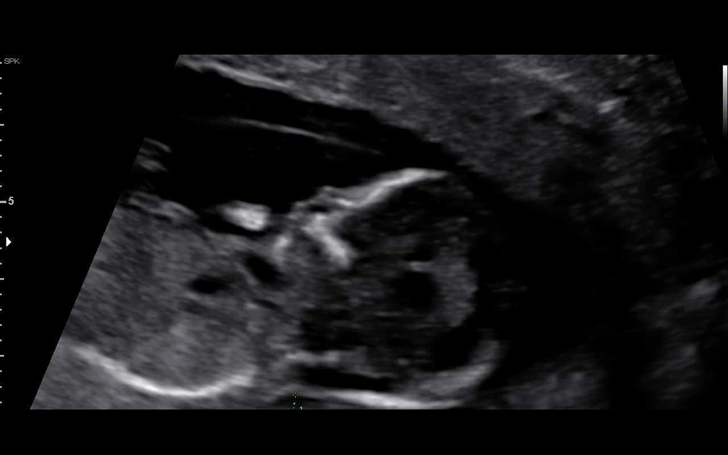
[im 46/46]
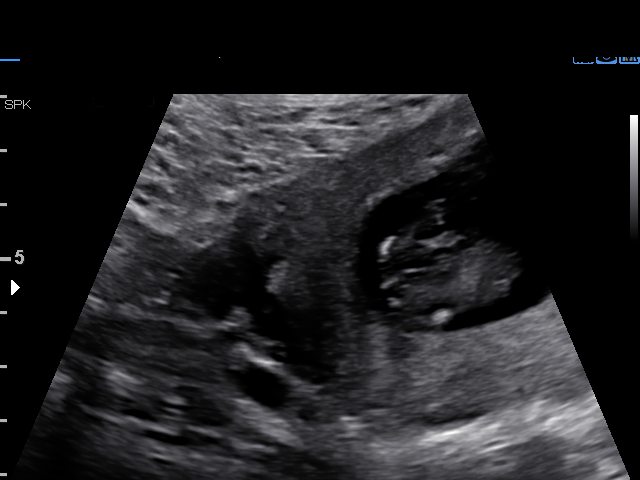

[15 of 28 positions shown; findings below may reference images not displayed]

TRANSLUCENCY

1  JOLSNTA REBISZ            [id number]      [id number]     [id number]
Indications

13 weeks gestation of pregnancy
Obesity complicating pregnancy, first
trimester
Encounter for nuchal translucency
Pre-existing diabetes, type 2, in pregnancy,
first trimester; on insulin
OB History

Blood Type:            Height:  5'3"   Weight (lb):  250       BMI:
Gravidity:    4         Term:   2        Prem:   0         SAB:   1
TOP:          0       Ectopic:  0        Living: 2
Fetal Evaluation

Num Of Fetuses:     1
Fetal Heart         133
Rate(bpm):
Cardiac Activity:   Observed
Placenta:           Anterior, above cervical os
Gestational Age

Best:          13w 2d     Det. By:  Previous Ultrasound      EDD:    07/05/16
(11/22/15)
1st Trimester Genetic Sonogram Screening

CRL:            79.9  mm    G. Age:   13w 5d                 EDD:    07/02/16
Nuc Trans:       1.9  mm

Nasal Bone:                 Present
Anatomy

Cranium:               Appears normal         Bladder:                Visualized
Choroid Plexus:        Appears normal         Upper Extremities:      Visualized
Stomach:               Visualized             Lower Extremities:      Visualized
Cervix Uterus Adnexa

Uterus
Normal shape and size.

Left Ovary
Within normal limits.

Right Ovary
Within normal limits.

Adnexa:       No abnormality visualized.
Impression

SIUP at 13+2 weeks
No gross abnormalities identified
NT measurement was within normal limits for this GA; NB
present
Normal amniotic fluid volume
Measurements consistent with prior US
Recommendations

Offer MSAFP in the second trimester for ONTD screening
Offer anatomy U/S by 18 weeks

## 2017-03-14 IMAGING — US US MFM OB FOLLOW-UP
1 series · 14 of 28 positions shown · non-contrast
Comparison: none

[Series 1: us mfm ob follow-up · 46 acquisitions, 14 frames shown]
[im 2/46]
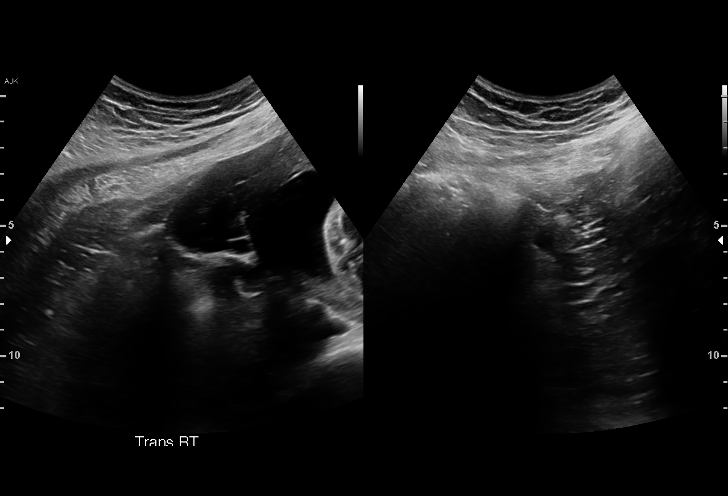
[im 6/46]
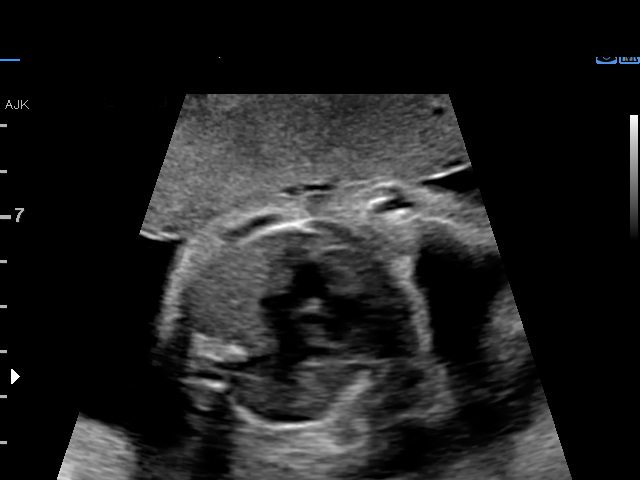
[im 9/46]
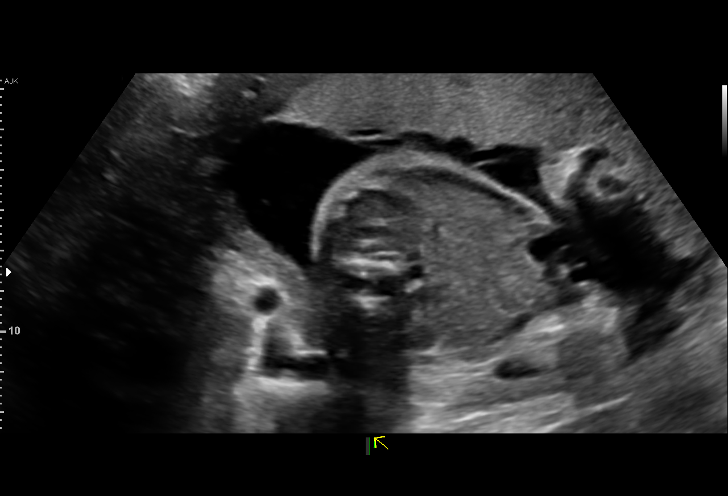
[im 12/46]
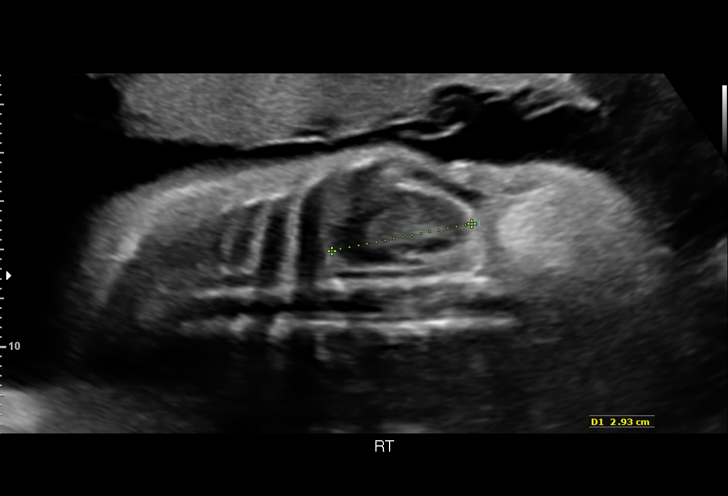
[im 16/46]
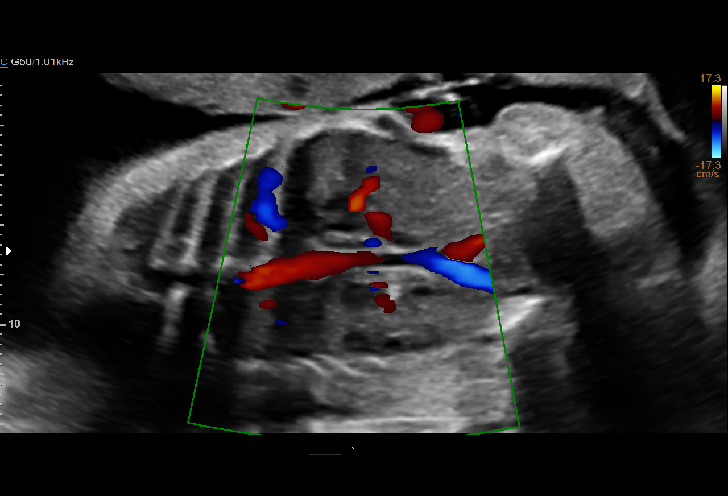
[im 19/46]
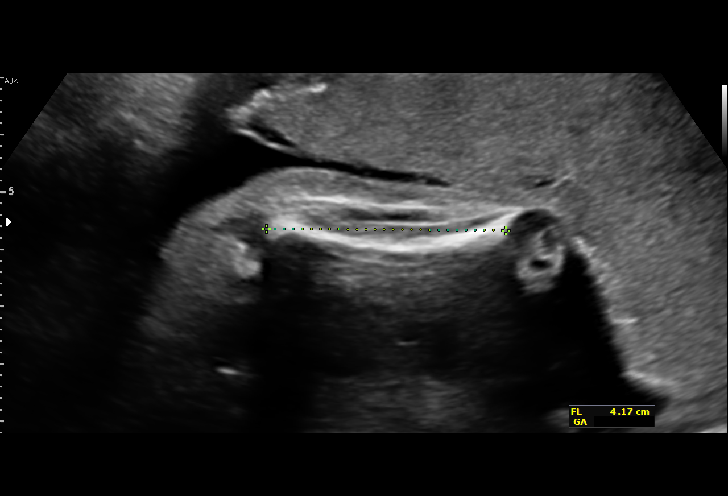
[im 22/46]
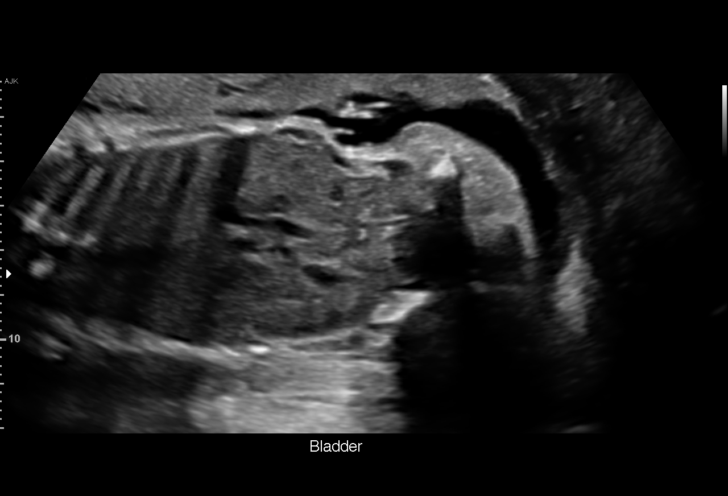
[im 26/46]
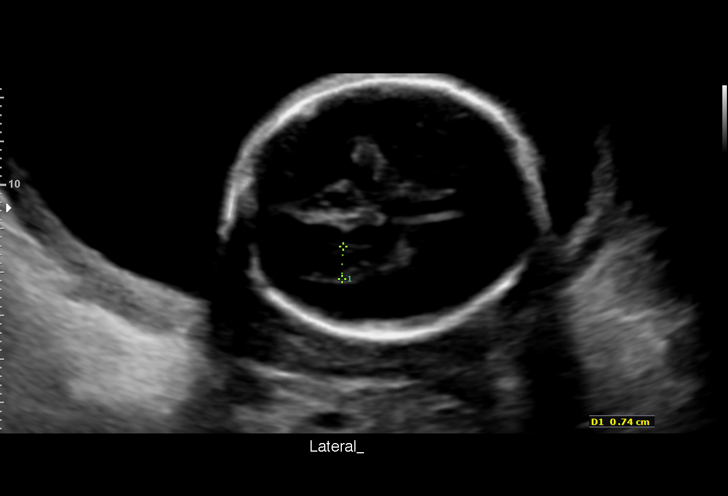
[im 29/46]
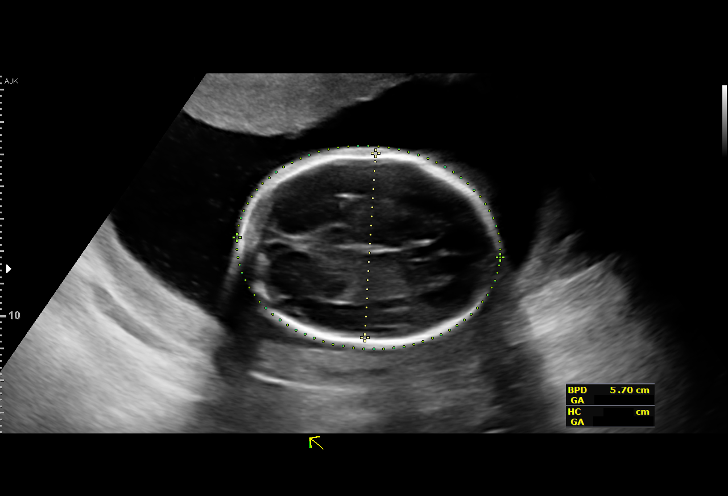
[im 32/46]
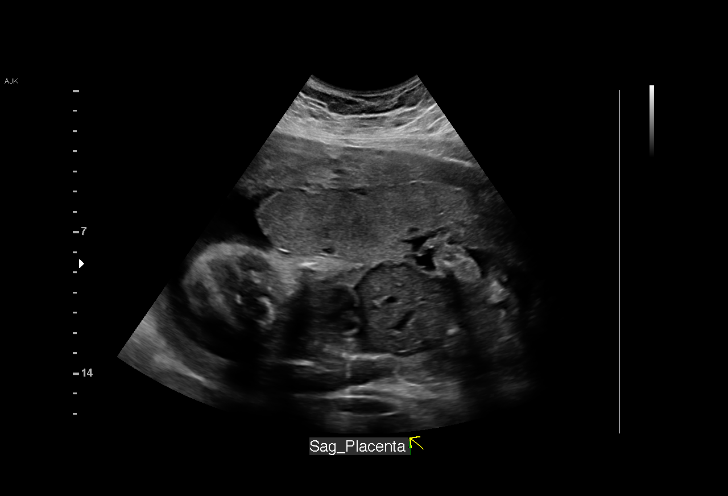
[im 36/46]
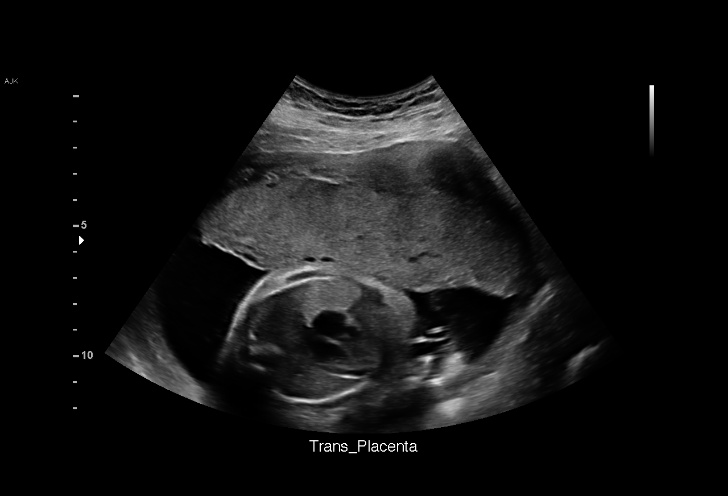
[im 39/46]
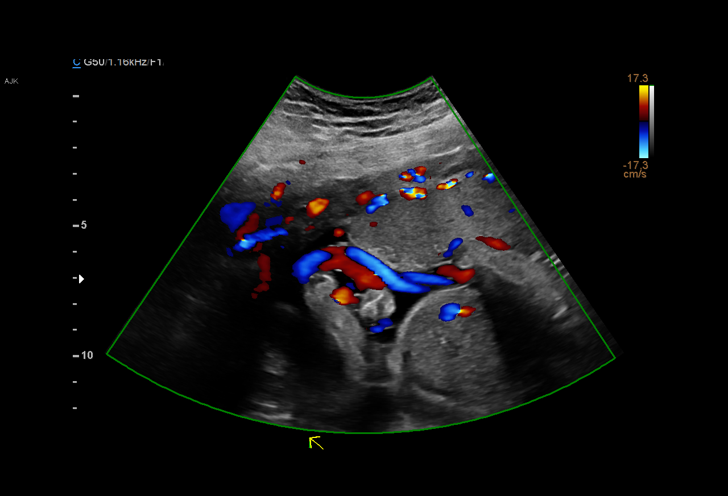
[im 42/46]
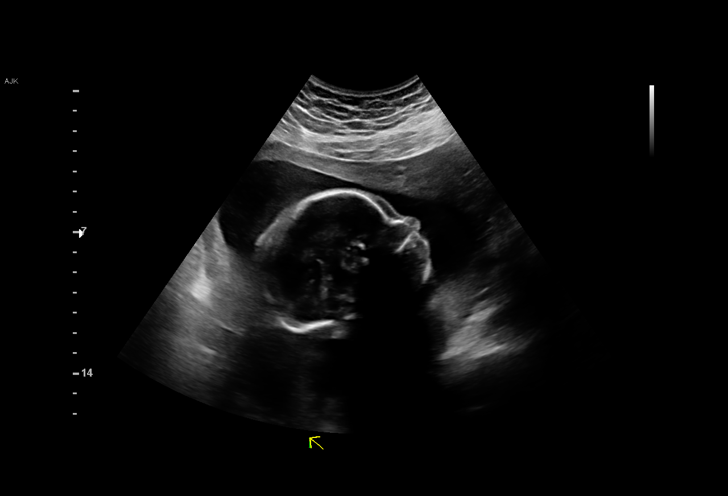
[im 46/46]
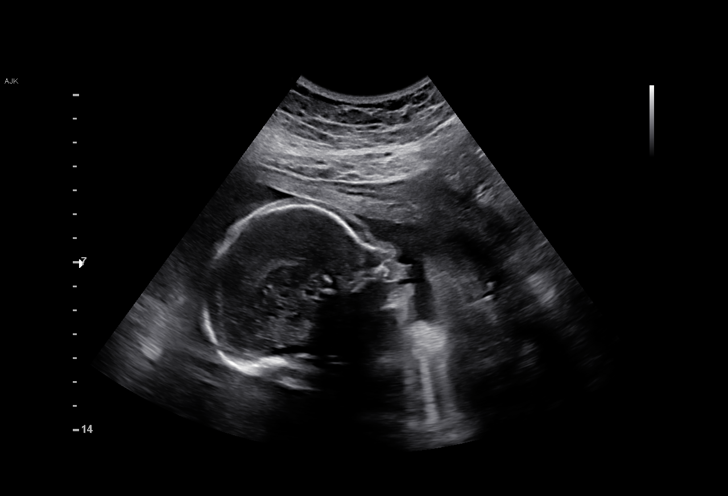

[14 of 28 positions shown; findings below may reference images not displayed]

1  ALIA TIGER            [id number]      [id number]     [id number]
Indications

24 weeks gestation of pregnancy
Obesity complicating pregnancy, second
trimester
Pre-existing diabetes, type 2, in pregnancy,
second trimester on insulin; [DATE] A1c =
7.3%
Fetal abnormality - other known or
suspected: (arthrogryposis)
OB History

Blood Type:            Height:  5'3"   Weight (lb):  250      BMI:
Gravidity:    4         Term:   2        Prem:   0        SAB:   1
TOP:          0       Ectopic:  0        Living: 2
Fetal Evaluation

Num Of Fetuses:     1
Fetal Heart         132
Rate(bpm):
Cardiac Activity:   Observed
Presentation:       Breech
Placenta:           Anterior, above cervical os
P. Cord Insertion:  Previously Visualized

Amniotic Fluid
AFI FV:      Subjectively within normal limits

Largest Pocket(cm)
6.06
Biometry

BPD:      57.3  mm     G. Age:  23w 4d         27  %    CI:        67.27   %   70 - 86
FL/HC:      18.3   %   18.7 -
HC:      223.7  mm     G. Age:  24w 3d         47  %    HC/AC:      1.07       1.05 -
AC:      208.7  mm     G. Age:  25w 3d         82  %    FL/BPD:     71.4   %   71 - 87
FL:       40.9  mm     G. Age:  23w 2d         18  %    FL/AC:      19.6   %   20 - 24

Est. FW:     696  gm      1 lb 9 oz     60  %
Gestational Age

U/S Today:     24w 1d                                        EDD:   07/04/16
Best:          24w 0d    Det. By:   Previous Ultrasound      EDD:   07/05/16
(11/22/15)
Anatomy

Cranium:               Appears normal         Aortic Arch:            Previously seen
Cavum:                 Appears normal         Ductal Arch:            Not well visualized
Ventricles:            Appears normal         Diaphragm:              Appears normal
Choroid Plexus:        Previously seen        Stomach:                Appears normal, left
sided
Cerebellum:            Previously seen        Abdomen:                Appears normal
Posterior Fossa:       Previously seen        Abdominal Wall:         Previously seen
Nuchal Fold:           Previously seen        Cord Vessels:           Previously seen
Face:                  Orbits and profile     Kidneys:                Appear normal
previously seen
Lips:                  Previously seen        Bladder:                Appears normal
Thoracic:              Appears normal         Spine:                  Previously seen
Heart:                 Appears normal         Upper Extremities:      Arthrogryposis
(4CH, axis, and situs
RVOT:                  Previously seen        Lower Extremities:      Clubfeet
LVOT:                  Previously seen

Other:  Gender not well visualized. Technically difficult due to fetal position.
Cervix Uterus Adnexa

Cervix
Appears closed, without funnelling.

Uterus
No abnormality visualized.

Left Ovary
Not visualized.

Right Ovary
Not visualized.

Adnexa:       No abnormality visualized.
Impression

Singleton intrauterine pregnancy at 24 weeks 0 days
gestation with fetal cardiac activity
Breech presentation
Arthrogryposis involving the upper and lower extremities
All other interval fetal anatomy was seen and appeared
normal; limited views of the DA
Normal amniotic fluid volume
Appropriate interval growth with EFW at the 60th %tile
Recommendations

Recommend follow-up ultrasound examination in 
 4 weeks
(pt did not want to make this appt until she talked with her OB)

## 2017-03-17 ENCOUNTER — Other Ambulatory Visit: Payer: Self-pay | Admitting: Family Medicine

## 2017-03-17 DIAGNOSIS — O24919 Unspecified diabetes mellitus in pregnancy, unspecified trimester: Secondary | ICD-10-CM

## 2017-03-18 DIAGNOSIS — N760 Acute vaginitis: Secondary | ICD-10-CM | POA: Diagnosis not present

## 2017-05-18 DIAGNOSIS — E1165 Type 2 diabetes mellitus with hyperglycemia: Secondary | ICD-10-CM | POA: Diagnosis not present

## 2017-05-18 DIAGNOSIS — R69 Illness, unspecified: Secondary | ICD-10-CM | POA: Diagnosis not present

## 2017-05-18 DIAGNOSIS — Z794 Long term (current) use of insulin: Secondary | ICD-10-CM | POA: Diagnosis not present

## 2017-05-25 DIAGNOSIS — N390 Urinary tract infection, site not specified: Secondary | ICD-10-CM | POA: Diagnosis not present

## 2017-07-25 DIAGNOSIS — E1165 Type 2 diabetes mellitus with hyperglycemia: Secondary | ICD-10-CM | POA: Diagnosis not present

## 2017-08-15 DIAGNOSIS — R69 Illness, unspecified: Secondary | ICD-10-CM | POA: Diagnosis not present

## 2017-08-26 ENCOUNTER — Encounter: Payer: Self-pay | Admitting: *Deleted

## 2017-08-26 ENCOUNTER — Other Ambulatory Visit: Payer: Self-pay | Admitting: *Deleted

## 2017-08-26 NOTE — Patient Outreach (Signed)
[facility]) Care Management  08/26/2017  Sue Cooper Mar 02, 1985 [id number]  Subjective: Telephone call to patient's home / mobile number, spoke with patient, and HIPAA verified.  [facility]iscussed [facility] Consult follow up, patient voiced understanding, and is in agreement to follow up.  Patient states she is doing well, diabetes being managed by primary MD, she and primary MD are monitoring her diabetes as needed.   States she recently started new medication, diabetes is stable, and has a follow up appointment with MD every 3 months. Discussed diabetes disease management resources for [facility] plan members, patient voices understanding, and declined referral at this time.  States she is aware of how to access benefit in the future if needed.  States she is accessing her [employer] benefits as needed via member services number on back of card. Patient states she is able to manage self care and has assistance as needed.   Patient voices understanding of medical diagnosis and treatment plan.  States she does not drink and denies history of Alcohol intoxication in active alcoholic.  States she will follow up with primary provider to get alcohol intoxication diagnosis removed from chart.  Patient states she does not have any education material, transition of care, care coordination, disease management, disease monitoring, transportation, community resource, or pharmacy needs at this time. States she is very appreciative of the follow up and is in agreement to receive [facility] information.      Objective: Per KPN (Knowledge Performance Now, point of care tool) and chart review, patient has had no hospitalizations or ED visits within the last year.  Patient has a history of diabetes.     Assessment: Received [facility] Consult referral on 08/24/17.   [facility] Consult follow up completed, no care management needs, and will proceed with case closure.       Plan:  RNCM will send patient successful outreach letter, [facility] pamph[facility], and magnet. RNCM will complete case closure due to follow up completed / no care management needs.        [name] RN, BSN, CCM [facility] Telephonic CM Phone: [phone] Fax: [phone]

## 2017-09-29 DIAGNOSIS — N3000 Acute cystitis without hematuria: Secondary | ICD-10-CM | POA: Diagnosis not present

## 2017-11-18 DIAGNOSIS — R69 Illness, unspecified: Secondary | ICD-10-CM | POA: Diagnosis not present

## 2017-11-18 DIAGNOSIS — E7800 Pure hypercholesterolemia, unspecified: Secondary | ICD-10-CM | POA: Diagnosis not present

## 2017-11-18 DIAGNOSIS — E1165 Type 2 diabetes mellitus with hyperglycemia: Secondary | ICD-10-CM | POA: Diagnosis not present

## 2017-11-18 DIAGNOSIS — Z6838 Body mass index (BMI) 38.0-38.9, adult: Secondary | ICD-10-CM | POA: Diagnosis not present

## 2018-08-02 ENCOUNTER — Encounter: Payer: Managed Care, Other (non HMO) | Admitting: Internal Medicine

## 2018-08-02 DIAGNOSIS — Z0289 Encounter for other administrative examinations: Secondary | ICD-10-CM

## 2018-08-02 NOTE — Progress Notes (Deleted)
Name: Sue Cooper  MRN/ DOB: [id number], 10-Jun-1985   Age/ Sex: 33 y.o., female    PCP: Donald Prose, MD   Reason for Endocrinology Evaluation: Type 2 Diabetes Mellitus     Date of Initial Endocrinology Visit: 08/02/2018     PATIENT IDENTIFIER: [name] is a 33 y.o. female with a past medical history of T2DM, dyslipidemia and anxiety . The patient presented for initial endocrinology clinic visit on 08/02/2018 for consultative assistance with her diabetes management.    HPI: [name] was    Diagnosed with T2DM in 2014 Prior Medications tried/Intolerance: *** Currently checking blood sugars *** x / day,  before breakfast and ***.  Hypoglycemia episodes : ***               Symptoms: ***                 Frequency: ***/  Hemoglobin A1c has ranged from 7.1% in 2018, peaking at 13.9 % in 2020 Patient required assistance for hypoglycemia:  Patient has required hospitalization within the last 1 year from hyper or hypoglycemia:   In terms of diet, the patient ***   HOME DIABETES REGIMEN: Basal: ***  Bolus: ***   Statin: yes ACE-I/ARB: {YES/NO:17245} Prior Diabetic Education: {Yes/No:11203}   METER DOWNLOAD SUMMARY: Date range evaluated: *** Fingerstick Blood Glucose Tests = *** Average Number Tests/Day = *** Overall Mean FS Glucose = *** Standard Deviation = ***  BG Ranges: Low = *** High = ***   Hypoglycemic Events/30 Days: BG < 50 = *** Episodes of symptomatic severe hypoglycemia = ***   DIABETIC COMPLICATIONS: Microvascular complications:   ***  Denies: ***  Last eye exam: Completed 04/2017  Macrovascular complications:   ***  Denies: CAD, PVD, CVA   PAST HISTORY: Past Medical History:  Past Medical History:  Diagnosis Date  . 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 Anxiety   . Diabetes mellitus   . Gestational diabetes 08/29/2010  . Headache   . History of shoulder dystocia in prior pregnancy     Past  Surgical History:  Past Surgical History:  Procedure Laterality Date  . FOOT SURGERY  2007   pin right foot      Social History:  reports that she has never smoked. She has never used smokeless tobacco. She reports that she does not drink alcohol or use drugs. Family History:  Family History  Problem Relation Age of Onset  . Diabetes Mother   . Diabetes Father   . Hypertension Maternal Grandmother       HOME MEDICATIONS: Allergies as of 08/02/2018      Reactions   Metformin And Related Nausea Only      Medication List       Accurate as of August 02, 2018  7:58 AM. If you have any questions, ask your nurse or doctor.        accu-chek softclix lancets Use as instructed   Accu-Chek Softclix Lancets lancets See admin instructions.   acetaminophen 500 MG tablet Commonly known as: TYLENOL Take 500 mg by mouth every 6 (six) hours as needed for moderate pain.   glucose blood test strip Commonly known as: Accu-Chek Aviva Plus Use as instructed   ibuprofen 600 MG tablet Commonly known as: ADVIL Take 1 tablet (600 mg total) by mouth every 6 (six) hours.   insulin aspart 100 UNIT/ML FlexPen Commonly known as: NOVOLOG 15unit [place] TID with meal   NovoLOG FlexPen  100 UNIT/ML FlexPen Generic drug: insulin aspart INJECT 22 UNITS INTO THE SKIN 3 TIMES DAILY WITH MEALS   PNV Prenatal Plus Multivitamin 27-1 MG Tabs Take 1 tablet by mouth daily.   sertraline 50 MG tablet Commonly known as: ZOLOFT Take 50 mg by mouth daily.   Tresiba 100 UNIT/ML Soln Generic drug: Insulin Degludec Inject 20 Units into the skin daily at 2 PM.        ALLERGIES: Allergies  Allergen Reactions  . Metformin And Related Nausea Only     REVIEW OF SYSTEMS: A comprehensive ROS was conducted with the patient and is negative except as per HPI and below:  ROS    OBJECTIVE:   VITAL SIGNS: There were no vitals taken for this visit.   PHYSICAL EXAM:  General: Pt appears well and is in NAD   Hydration: Well-hydrated with moist mucous membranes and good skin turgor  HEENT: Head: Unremarkable with good dentition. Oropharynx clear without exudate.  Eyes: External eye exam normal without stare, lid lag or exophthalmos.  EOM intact.  PERRL.  Neck: General: Supple without adenopathy or carotid bruits. Thyroid: Thyroid size normal.  No goiter or nodules appreciated. No thyroid bruit.  Lungs: Clear with good BS bilat with no rales, rhonchi, or wheezes  Heart: RRR with normal S1 and S2 and no gallops; no murmurs; no rub  Abdomen: Normoactive bowel sounds, soft, nontender, without masses or organomegaly palpable  Extremities:  Lower extremities - No pretibial edema. No lesions.  Skin: Normal texture and temperature to palpation. No rash noted. No Acanthosis nigricans/skin tags. No lipohypertrophy.  Neuro: MS is good with appropriate affect, pt is alert and Ox3    DM foot exam:    DATA REVIEWED:  Lab Results  Component Value Date   HGBA1C 7.1 (H) 02/18/2016   HGBA1C 7.3 (H) 01/21/2016   HGBA1C 8.2 (H) 12/29/2015   06/28/2018 A1c 13.9% BUN/Cr. 11/0.61 GFR 139  LDL  HDL 54 TG 145 LDL 168        ASSESSMENT / PLAN / RECOMMENDATIONS:   1) Type 2 Diabetes Mellitus, Poorly controlled, With*** complications - Most recent A1c of 13.9 %. Goal A1c < 7.0 %.    Plan: GENERAL:  ***  MEDICATIONS:  ***  EDUCATION / INSTRUCTIONS:  BG monitoring instructions: Patient is instructed to check her blood sugars *** times a day, ***.  Call [facility] if: BG persistently < 70 or > 300. . I reviewed the Rule of 15 for the treatment of hypoglycemia in detail with the patient. Literature supplied.   2) Diabetic complications:   Eye: Does *** have known diabetic retinopathy.   Neuro/ Feet: Does *** have known diabetic peripheral neuropathy.  Renal: Patient does *** have known baseline CKD. She is *** on an ACEI/ARB at present.Check urine albumin/creatinine ratio  yearly starting at time of diagnosis. If albuminuria is positive, treatment is geared toward better glucose, blood pressure control and use of ACE inhibitors or ARBs. Monitor electrolytes and creatinine once to twice yearly.   3) Lipids: Patient is *** on a statin.    4) Hypertension: ***  at goal of < 140/90 mmHg.       Signed electronically by: Lyndle HerrlichAbby Jaralla Kaian Fahs, MD  [facility]  [facility] [address] [place], [place] [address] Phone: [phone] FAX: [phone]   CC: Deatra JamesSun, Vyvyan, MD [address] [place] [address] Phone: [phone]  Fax: [phone]    Return  to Endocrinology clinic as below: Future Appointments  Date Time Provider Department Center  08/02/2018  8:10 AM Camillo Quadros, Konrad DoloresIbtehal Jaralla, MD [id number] None

## 2018-08-22 ENCOUNTER — Encounter: Payer: Self-pay | Admitting: Internal Medicine

## 2018-08-22 ENCOUNTER — Other Ambulatory Visit: Payer: Self-pay

## 2018-08-22 ENCOUNTER — Ambulatory Visit (INDEPENDENT_AMBULATORY_CARE_PROVIDER_SITE_OTHER): Payer: Managed Care, Other (non HMO) | Admitting: Internal Medicine

## 2018-08-22 VITALS — BP 124/84 | HR 88 | Temp 98.0°F | Ht 64.0 in | Wt 229.4 lb

## 2018-08-22 DIAGNOSIS — E1165 Type 2 diabetes mellitus with hyperglycemia: Secondary | ICD-10-CM | POA: Diagnosis not present

## 2018-08-22 DIAGNOSIS — E785 Hyperlipidemia, unspecified: Secondary | ICD-10-CM | POA: Diagnosis not present

## 2018-08-22 DIAGNOSIS — Z794 Long term (current) use of insulin: Secondary | ICD-10-CM

## 2018-08-22 LAB — GLUCOSE, POCT (MANUAL RESULT ENTRY): POC Glucose: 94 mg/dl (ref 70–99)

## 2018-08-22 MED ORDER — INSULIN ASPART 100 UNIT/ML FLEXPEN: 22.0000 [IU] | PEN_INJECTOR | Freq: Three times a day (TID) | SUBCUTANEOUS | 11 refills | Status: AC

## 2018-08-22 MED ORDER — METFORMIN HCL 500 MG PO TABS: 500.0000 mg | ORAL_TABLET | Freq: Every day | ORAL | 3 refills | Status: AC

## 2018-08-22 NOTE — Progress Notes (Signed)
Name: Sue Cooper  MRN/ DOB: [id number], 1985/08/01   Age/ Sex: 33 y.o., female    PCP: Sue Prose, MD   Reason for Endocrinology Evaluation: Type 2 Diabetes Mellitus     Date of Initial Endocrinology Visit: 08/22/2018     PATIENT IDENTIFIER: Sue Cooper is a 33 y.o. female with a past medical history of T2DM and anxiety. The patient presented for initial endocrinology clinic visit on 08/22/2018 for consultative assistance with her diabetes management.    HPI: Sue Cooper was    Diagnosed with T2DM in 2014. She was diagnosed with gestational diabetes in 2009, was on metformin during that pregnancy, her 2nd pregnancy in 2012 and was on insulin. She was to be off medication until 2014 when she was started on insulin.  Prior Medications tried/Intolerance: Metformin - GI issues while pregnant Currently checking blood sugars 3 x / day,  before meals Hypoglycemia episodes : yes   - fasting            Symptoms: shaky                Frequency: rarely Hemoglobin A1c has ranged from 7.1% in 2018, peaking at 13.9%  in 2020. Patient required assistance for hypoglycemia: no Patient has required hospitalization within the last 1 year from hyper or hypoglycemia: no  In terms of diet, the patient eats 3 meals a day, snacks twice, drinks occasional sugar-sweetened beverages.    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   HOME DIABETES REGIMEN: Tresiba 20 units daily  Novolog 25 units    Statin:Yes  ACE-I/ARB: no Prior Diabetic Education: long time ago    METER DOWNLOAD SUMMARY: Did not bring   DIABETIC COMPLICATIONS: Microvascular complications:    Denies: CKD, neuropathy, retinopathy  Last eye exam: Completed 2020  Macrovascular complications:    Denies: CAD, PVD, CVA   PAST HISTORY: Past Medical History:  Past Medical History:  Diagnosis Date  . Alcohol  intoxication in active alcoholic ([place]) 83/15/1761  . Anxiety   . Diabetes mellitus   . Gestational diabetes 08/29/2010  . Headache   . History of shoulder dystocia in prior pregnancy    Past Surgical History:  Past Surgical History:  Procedure Laterality Date  . FOOT SURGERY  2007   pin right foot      Social History:  reports that she has never smoked. She has never used smokeless tobacco. She reports that she does not drink alcohol or use drugs. Family History:  Family History  Problem Relation Age of Onset  . Diabetes Mother   . Diabetes Father   . Hypertension Maternal Grandmother      HOME MEDICATIONS: Allergies as of 08/22/2018      Reactions   Metformin And Related Nausea Only      Medication List       Accurate as of August 22, 2018  8:56 AM. If you have any questions, ask your nurse or doctor.        accu-chek softclix lancets Use as instructed   Accu-Chek Softclix Lancets lancets See admin instructions.   acetaminophen 500 MG tablet Commonly known as: TYLENOL Take 500 mg by mouth every 6 (six) hours as needed for moderate pain.   glucose blood test strip Commonly known as: Accu-Chek Aviva Plus Use as  instructed   ibuprofen 600 MG tablet Commonly known as: ADVIL Take 1 tablet (600 mg total) by mouth every 6 (six) hours.   insulin aspart 100 UNIT/ML FlexPen Commonly known as: NOVOLOG 15unit [place] TID with meal What changed: how much to take   NovoLOG FlexPen 100 UNIT/ML FlexPen Generic drug: insulin aspart INJECT 22 UNITS INTO THE SKIN 3 TIMES DAILY WITH MEALS What changed: See the new instructions.   PNV Prenatal Plus Multivitamin 27-1 MG Tabs Take 1 tablet by mouth daily.   rosuvastatin 20 MG tablet Commonly known as: CRESTOR Take 20 mg by mouth daily.   sertraline 50 MG tablet Commonly known as: ZOLOFT Take 50 mg by mouth daily.   Tresiba 100 UNIT/ML Soln Generic drug: Insulin Degludec Inject 20 Units into the skin daily at 2 PM.         ALLERGIES: Allergies  Allergen Reactions  . Metformin And Related Nausea Only     REVIEW OF SYSTEMS: A comprehensive ROS was conducted with the patient and is negative except as per HPI and below:  Review of Systems  Respiratory: Negative for cough and shortness of breath.   Cardiovascular: Negative for chest pain and palpitations.  Genitourinary: Negative for frequency.  Neurological: Negative for tingling and tremors.  Endo/Heme/Allergies: Negative for polydipsia.  Psychiatric/Behavioral: Negative for depression. The patient is nervous/anxious.       OBJECTIVE:   VITAL SIGNS: BP 124/84 (BP Location: Left Arm, Patient Position: Sitting, Cuff Size: Normal)   Pulse 88   Temp 98 F (36.7 C)   Ht 5\' 4"  (1.626 m)   Wt 229 lb 6.4 oz (104.1 kg)   SpO2 99%   BMI 39.38 kg/m    PHYSICAL EXAM:  General: Pt appears well and is in NAD  Hydration: Well-hydrated with moist mucous membranes and good skin turgor  HEENT: Head: Unremarkable with good dentition. Oropharynx clear without exudate.  Eyes: External eye exam normal without stare, lid lag or exophthalmos.  EOM intact.   Neck: General: Supple without adenopathy or carotid bruits. Thyroid: Thyroid size normal.  No goiter or nodules appreciated. No thyroid bruit.  Lungs: Clear with good BS bilat with no rales, rhonchi, or wheezes  Heart: RRR with normal S1 and S2 and no gallops; no murmurs; no rub  Abdomen: Normoactive bowel sounds, soft, nontender, without masses or organomegaly palpable  Extremities:  Lower extremities - No pretibial edema. No lesions.  Skin: Normal texture and temperature to palpation. No rash noted. No Acanthosis nigricans/skin tags. No lipohypertrophy.  Neuro: MS is good with appropriate affect, pt is alert and Ox3    DM foot exam: 08/22/2018  The skin of the feet is intact without sores or ulcerations. The pedal pulses are 2+ on right and 2+ on left. The sensation is intact to a screening 5.07, 10  gram monofilament bilaterally   DATA REVIEWED: 06/28/2018 A1c 13.9 % Gluc 105 BUN/ Cr 11/0.61 GFR 139  K 4.2 TG 145 HDL 54 LDL 168        ASSESSMENT / PLAN / RECOMMENDATIONS:   1) Type 2 Diabetes Mellitus, Poorly controlled, Without complications - Most recent A1c of  13.9 %. Goal A1c < 7.0 %.   Plan: GENERAL: I have discussed with the patient the pathophysiology of diabetes. We went over the natural progression of the disease. We talked about both insulin resistance and insulin deficiency. We stressed the importance of lifestyle changes including diet and exercise. I explained the complications associated with diabetes including  retinopathy, nephropathy, neuropathy as well as increased risk of cardiovascular disease. We went over the benefit seen with glycemic control.   I explained to the patient that diabetic patients are at higher than normal risk for amputations.   With an A1c of 13.9% pt was not taking her medication, pt had some financial constraints at the time, pt works full time , she is a single mother to 3 kids, her parents help her.   We discussed the importance of lifestyle changes and exercise to help with diabetes care  She tends to drop after her breakfast meal with 25 units of Novolog, this is most likely due to insulin-CHO mismatch Discussed pharmacokinetics of basal/bolus insulin and the importance of taking prandial insulin with meals.   We also discussed avoiding sugar-sweetened beverages and snacks, when possible.   Will try and restart Metformin , in the past she was intolerant to it  But this was during pregnancy, metformin is a good add -on therapy to improve insulin resistant. Pt cautioned against GI side effects, she was instructed to take it with a meal  We also discussed other add-on therapy with GLP-1 agonists, TZD and SGLT-2 inhibitors, we discussed the benefits and side effects of each class    MEDICATIONS:  Continue Tresiba 20 units daily    Decrease Novolog to 22 units TIDQAC  Start Metformin 500 mg daily   EDUCATION / INSTRUCTIONS:  BG monitoring instructions: Patient is instructed to check her blood sugars 3 times a day, before meals.  Call [facility] if: BG persistently < 70 or > 300. . I reviewed the Rule of 15 for the treatment of hypoglycemia in detail with the patient. Literature supplied.   2) Diabetic complications:   Eye: Does not have known diabetic retinopathy.   Neuro/ Feet: Does not have known diabetic peripheral neuropathy.  Renal: Patient does not have known baseline CKD. She is not on an ACEI/ARB at present.   3) Lipids: Patient is on a statin, most likely non-compliant with this .    F/u in 2 months    45 minutes was spent with the pt, > 50% of the time was spent in counseling and education    Signed electronically by: Lyndle HerrlichAbby Jaralla Burnie Therien, MD  [facility]  [facility] [address] [place], [place] [address] Phone: [phone] FAX: [phone]   CC: Deatra JamesSun, Vyvyan, MD [address] [place] [address] Phone: [phone]  Fax: [phone]    Return to Endocrinology clinic as below: No future appointments.

## 2018-08-22 NOTE — Patient Instructions (Addendum)
-   Continue Tresiba at 20 units daily  - Decrease Novolog to 22 units with each meal  - Start Metformin 500 mg, one tablet with a meal      Choose healthy, lower carb lower calorie snacks: toss salad, cooked vegetables, cottage cheese, peanut butter, low fat cheese / string cheese, lower sodium deli meat, tuna salad or chicken salad        - HOW TO TREAT LOW BLOOD SUGARS (Blood sugar LESS THAN 70 MG/DL)  Please follow the RULE OF 15 for the treatment of hypoglycemia treatment (when your (blood sugars are less than 70 mg/dL)    STEP 1: Take 15 grams of carbohydrates when your blood sugar is low, which includes:   3-4 GLUCOSE TABS  OR  3-4 OZ OF JUICE OR REGULAR SODA OR  ONE TUBE OF GLUCOSE GEL     STEP 2: RECHECK blood sugar in 15 MINUTES STEP 3: If your blood sugar is still low at the 15 minute recheck --> then, go back to STEP 1 and treat AGAIN with another 15 grams of carbohydrates.

## 2018-10-12 ENCOUNTER — Ambulatory Visit: Payer: Managed Care, Other (non HMO) | Admitting: Dietician

## 2018-10-16 NOTE — Progress Notes (Deleted)
Name: Sue Cooper  Age/ Sex: 33 y.o., female   MRN/ DOB: [id number], 08-01-1985     PCP: Donald Prose, MD   Reason for Endocrinology Evaluation: Type 2 Diabetes Mellitus  Initial Endocrine Consultative Visit: 08/22/2018    PATIENT IDENTIFIER: [name] is a 33 y.o. female with a past medical history of T2DM, and Anxiety. The patient has followed with Endocrinology clinic since 08/22/2018 for consultative assistance with management of her diabetes.  DIABETIC HISTORY:  Ms. Vasko was diagnosed with T2DM in 2014.She was diagnosed with gestational diabetes in 2009, was put on  metformin during that pregnancy,by her 2nd pregnancy in 2012 she was on insulin. She was off medications until 2014 when she was started on insulin again after her diagnosis with T2DM. Her hemoglobin A1c has ranged from 7.1% in 2018, peaking at 13.9%  in 2020.  On her initial visit to our clinic in 07/2018, A1c was 13.9%, was on MDI regimen and we added metformin.   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    SUBJECTIVE:   During the last visit (08/22/2018): A1c 13.9%. Added metformin to MDI regimen   Today (10/17/2018): Ms. Speich is here for a 2 month follow up on diabetes management.  She checks her blood sugars *** times daily, preprandial to breakfast and ***. The patient has *** had hypoglycemic episodes since the last clinic visit, which typically occur *** x / - most often occuring ***. The patient is *** symptomatic with these episodes, with symptoms of {symptoms; hypoglycemia:9084048}. Otherwise, the patient has not required any recent emergency interventions for hypoglycemia and has not had recent hospitalizations secondary to hyper or hypoglycemic episodes.    ROS: As per HPI and as detailed below: ROS    HOME DIABETES REGIMEN:   Tresiba 20 units daily   Novolog to 22 units TIDQAC    Metformin 500 mg daily      METER DOWNLOAD SUMMARY: Date range evaluated: *** Fingerstick Blood Glucose Tests = *** Average Number Tests/Day = *** Overall Mean FS Glucose = *** Standard Deviation = ***  BG Ranges: Low = *** High = ***   Hypoglycemic Events/30 Days: BG < 50 = *** Episodes of symptomatic severe hypoglycemia = ***    DIABETIC COMPLICATIONS: Microvascular complications:   ***  Denies:   Last Eye Exam: Completed   Macrovascular complications:   ***  Denies: CAD, CVA, PVD   HISTORY:  Past Medical History:  Past Medical History:  Diagnosis Date  . Alcohol intoxication in active alcoholic ([place]) 52/77/8242  . Anxiety   . Diabetes mellitus   . Gestational diabetes 08/29/2010  . Headache   . History of shoulder dystocia in prior pregnancy    Past Surgical History:  Past Surgical History:  Procedure Laterality Date  . FOOT SURGERY  2007   pin right foot    Social History:  reports that she has never smoked. She has never used smokeless tobacco. She reports that she does not drink alcohol or use drugs. Family History:  Family History  Problem Relation Age of Onset  . Diabetes Mother   . Diabetes Father   . Hypertension Maternal Grandmother      HOME MEDICATIONS: Allergies as of 10/17/2018      Reactions   Metformin And Related Nausea Only  Medication List       Accurate as of October 17, 2018  7:34 AM. If you have any questions, ask your nurse or doctor.        accu-chek softclix lancets Use as instructed   Accu-Chek Softclix Lancets lancets See admin instructions.   acetaminophen 500 MG tablet Commonly known as: TYLENOL Take 500 mg by mouth every 6 (six) hours as needed for moderate pain.   glucose blood test strip Commonly known as: Accu-Chek Aviva Plus Use as instructed   ibuprofen 600 MG tablet Commonly known as: ADVIL Take 1 tablet (600 mg total) by mouth every 6 (six) hours.   metFORMIN 500 MG tablet  Commonly known as: GLUCOPHAGE Take 1 tablet (500 mg total) by mouth daily with breakfast.   NovoLOG FlexPen 100 UNIT/ML FlexPen Generic drug: insulin aspart INJECT 22 UNITS INTO THE SKIN 3 TIMES DAILY WITH MEALS What changed: See the new instructions.   insulin aspart 100 UNIT/ML FlexPen Commonly known as: NOVOLOG Inject 22 Units into the skin 3 (three) times daily with meals. 15unit [place] TID with meal What changed: Another medication with the same name was changed. Make sure you understand how and when to take each.   rosuvastatin 20 MG tablet Commonly known as: CRESTOR Take 20 mg by mouth daily.   sertraline 50 MG tablet Commonly known as: ZOLOFT Take 50 mg by mouth daily.   Tresiba 100 UNIT/ML Soln Generic drug: Insulin Degludec Inject 20 Units into the skin daily at 2 PM.        OBJECTIVE:   Vital Signs: There were no vitals taken for this visit.  Wt Readings from Last 3 Encounters:  08/22/18 229 lb 6.4 oz (104.1 kg)  07/29/16 242 lb 11.2 oz (110.1 kg)  06/16/16 278 lb 4.8 oz (126.2 kg)     Exam: General: Pt appears well and is in NAD  Neck: General: Supple without adenopathy. Thyroid: Thyroid size normal.  No goiter or nodules appreciated. No thyroid bruit.  Lungs: Clear with good BS bilat with no rales, rhonchi, or wheezes  Heart: RRR with normal S1 and S2 and no gallops; no murmurs; no rub  Abdomen: Normoactive bowel sounds, soft, nontender, without masses or organomegaly palpable  Extremities: No pretibial edema. No tremor.   Skin: Normal texture and temperature to palpation. No rash noted.   Neuro: MS is good with appropriate affect, pt is alert and Ox3       DM foot exam: 08/22/2018  The skin of the feet is intact without sores or ulcerations. The pedal pulses are 2+ on right and 2+ on left. The sensation is intact to a screening 5.07, 10 gram monofilament bilaterally  DATA REVIEWED:  Lab Results  Component Value Date   HGBA1C 7.1 (H) 02/18/2016    HGBA1C 7.3 (H) 01/21/2016   HGBA1C 8.2 (H) 12/29/2015   06/28/2018 A1c 13.9 % Gluc 105 BUN/ Cr 11/0.61 GFR 139  K 4.2 TG 145 HDL 54 LDL 168  ASSESSMENT / PLAN / RECOMMENDATIONS:   1) Type 2 Diabetes Mellitus, Poorly controlled, Without complications - Most recent A1c of *** %. Goal A1c < 7.0 %.    Plan: MEDICATIONS:  ***  EDUCATION / INSTRUCTIONS:  BG monitoring instructions: Patient is instructed to check her blood sugars *** times a day, ***.  Call [facility] if: BG persistently < 70 or > 300. . I reviewed the Rule of 15 for the treatment of hypoglycemia in detail with the patient. Literature  supplied.  REFERRALS:  ***.   2) Diabetic complications:   Eye: Does *** have known diabetic retinopathy.   Neuro/ Feet: Does *** have known diabetic peripheral neuropathy .   Renal: Patient does *** have known baseline CKD. She   is *** on an ACEI/ARB at present. Check urine albumin/creatinine ratio yearly starting at time of diagnosis. If albuminuria is positive, treatment is geared toward better glucose, blood pressure control and use of ACE inhibitors or ARBs. Monitor electrolytes and creatinine once to twice yearly.   3) Lipids: Patient is *** on a statin.  4) Hypertension: *** at goal of < 140/90 mmHg.    F/U in ***    Signed electronically by: Lyndle Herrlich, MD  [facility]  [facility] [address] [place], [place] [address] Phone: [phone] FAX: [phone]   CC: Deatra James, MD [address] [place] [address] Phone: [phone]  Fax: [phone]  Return to Endocrinology clinic as below: Future Appointments  Date Time Provider Department Center  10/17/2018  8:50 AM Kato Wieczorek, Konrad Dolores, MD [facility] None

## 2018-10-17 ENCOUNTER — Encounter: Payer: Managed Care, Other (non HMO) | Admitting: Internal Medicine

## 2018-10-17 DIAGNOSIS — Z0289 Encounter for other administrative examinations: Secondary | ICD-10-CM

## 2019-07-16 ENCOUNTER — Ambulatory Visit: Payer: 59 | Admitting: Internal Medicine

## 2019-07-18 NOTE — Progress Notes (Deleted)
Name: Sue Cooper  Age/ Sex: 34 y.o., female   MRN/ DOB: [id number], 1985-03-05     PCP: Donald Prose, MD   Reason for Endocrinology Evaluation: Type 2 Diabetes Mellitus  Initial Endocrine Consultative Visit: 08/22/2018    PATIENT IDENTIFIER: Sue Cooper is a 34 y.o. female with a past medical history of T2Dm and Anxiety d/o. The patient has followed with Endocrinology clinic since 08/22/2018 for consultative assistance with management of her diabetes.  DIABETIC HISTORY:  Sue Cooper was diagnosed with DM in 2014. She was diagnosed with gestational diabetes in 2009, was on metformin during that pregnancy, her 2nd pregnancy in 2012 and was on insulin. Insulin was restarted in 2014 . Her hemoglobin A1c has ranged from 7.1% in 2018, peaking at 13.9%  in 2020.  Has 3 kids 53, 73, and 3 ( has physical disability ) parents help her.   On her initial visit to our clinic, her A1c was 13.9%  , she was on Tresiba and Novolog , we added metformin   SUBJECTIVE:   During the last visit (08/22/2018): A1c 13.9% We continued tresiba and novolog and added metformin   Today (07/18/2019): Sue Cooper  She checks her blood sugars *** times daily, preprandial to breakfast and ***. The patient has *** had hypoglycemic episodes since the last clinic visit, which typically occur *** x / - most often occuring ***. The patient is *** symptomatic with these episodes, with symptoms of {symptoms; hypoglycemia:9084048}     HOME DIABETES REGIMEN:  Tresiba 20 units daily  Novolog to 22 units TIDQAC Metformin 500 mg daily      Statin: *** ACE-I/ARB: *** Prior Diabetic Education: ***   METER DOWNLOAD SUMMARY: Date range evaluated: *** Fingerstick Blood Glucose Tests = *** Average Number Tests/Day = *** Overall Mean FS Glucose = *** Standard Deviation = ***  BG Ranges: Low = *** High = ***   Hypoglycemic Events/30 Days: BG < 50 = *** Episodes of symptomatic severe hypoglycemia =  ***    DIABETIC COMPLICATIONS: Microvascular complications:    Denies: CKD, neuropathy, retinopathy  Last eye exam: Completed 2020  Macrovascular complications:    Denies: CAD, PVD, CVA     HISTORY:  Past Medical History:  Past Medical History:  Diagnosis Date  . 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 Anxiety   . Diabetes mellitus   . Gestational diabetes 08/29/2010  . Headache   . History of shoulder dystocia in prior pregnancy     Past Surgical History:  Past Surgical History:  Procedure Laterality Date  . FOOT SURGERY  2007   pin right foot     Social History:  reports that she has never smoked. She has never used smokeless tobacco. She reports that she does not drink alcohol and does not use drugs. Family History:  Family History  Problem Relation Age of Onset  . Diabetes Mother   . Diabetes Father   . Hypertension Maternal Grandmother       HOME MEDICATIONS: Allergies as of 07/19/2019      Reactions   Metformin And Related Nausea Only      Medication List       Accurate as of July 18, 2019  2:07 PM. If you have any questions, ask your nurse or doctor.        accu-chek softclix lancets Use as instructed   Accu-Chek Softclix Lancets lancets See admin instructions.   acetaminophen 500 MG tablet Commonly known as:  TYLENOL Take 500 mg by mouth every 6 (six) hours as needed for moderate pain.   glucose blood test strip Commonly known as: Accu-Chek Aviva Plus Use as instructed   ibuprofen 600 MG tablet Commonly known as: ADVIL Take 1 tablet (600 mg total) by mouth every 6 (six) hours.   metFORMIN 500 MG tablet Commonly known as: GLUCOPHAGE Take 1 tablet (500 mg total) by mouth daily with breakfast.   NovoLOG FlexPen 100 UNIT/ML FlexPen Generic drug: insulin aspart INJECT 22 UNITS INTO THE SKIN 3 TIMES DAILY WITH MEALS What changed: See the new instructions.   insulin aspart 100 UNIT/ML FlexPen Commonly  known as: NOVOLOG Inject 22 Units into the skin 3 (three) times daily with meals. 15unit [place] TID with meal What changed: Another medication with the same name was changed. Make sure you understand how and when to take each.   rosuvastatin 20 MG tablet Commonly known as: CRESTOR Take 20 mg by mouth daily.   sertraline 50 MG tablet Commonly known as: ZOLOFT Take 50 mg by mouth daily.   Tresiba 100 UNIT/ML Soln Generic drug: Insulin Degludec Inject 20 Units into the skin daily at 2 PM.        OBJECTIVE:   Vital Signs: There were no vitals taken for this visit.  Wt Readings from Last 3 Encounters:  08/22/18 229 lb 6.4 oz (104.1 kg)  07/29/16 242 lb 11.2 oz (110.1 kg)  06/16/16 278 lb 4.8 oz (126.2 kg)     Exam: General: Pt appears well and is in NAD  Hydration: Well-hydrated with moist mucous membranes and good skin turgor  HEENT: Head: Unremarkable with good dentition. Oropharynx clear without exudate.  Eyes: External eye exam normal without stare, lid lag or exophthalmos.  EOM intact.  PERRL.  Neck: General: Supple without adenopathy. Thyroid: Thyroid size normal.  No goiter or nodules appreciated. No thyroid bruit.  Lungs: Clear with good BS bilat with no rales, rhonchi, or wheezes  Heart: RRR with normal S1 and S2 and no gallops; no murmurs; no rub  Abdomen: Normoactive bowel sounds, soft, nontender, without masses or organomegaly palpable  Extremities: No pretibial edema. No tremor. Normal strength and motion throughout. See detailed diabetic foot exam below.  Skin: Normal texture and temperature to palpation. No rash noted. No Acanthosis nigricans/skin tags. No lipohypertrophy.  Neuro: MS is good with appropriate affect, pt is alert and Ox3    DM foot exam: Please see diabetic assessment flow-sheet detailed below:           DATA REVIEWED:  Lab Results  Component Value Date   HGBA1C 7.1 (H) 02/18/2016   HGBA1C 7.3 (H) 01/21/2016   HGBA1C 8.2 (H) 12/29/2015    Lab Results  Component Value Date   LDLCALC 124 (H) 01/08/2013   CREATININE 0.61 12/17/2015   No results found for: [facility]   Lab Results  Component Value Date   CHOL 180 01/08/2013   HDL 42 01/08/2013   LDLCALC 124 (H) 01/08/2013   TRIG 70 01/08/2013   CHOLHDL 4.3 01/08/2013         ASSESSMENT / PLAN / RECOMMENDATIONS:   1) Type {NUMBERS 1 OR 2:522190} Diabetes Mellitus, ***controlled, With *** complications - Most recent A1c of *** %. Goal A1c < *** %.  ***  Plan: MEDICATIONS:  ***  EDUCATION / INSTRUCTIONS:  BG monitoring instructions: Patient is instructed to check her blood sugars *** times a day, ***.  Call [place] [facility] if: BG persistently < 70  or > 300. . I reviewed the Rule of 15 for the treatment of hypoglycemia in detail with the patient. Literature supplied.  REFERRALS:  ***.   2) Diabetic complications:   Eye: Does *** have known diabetic retinopathy.   Neuro/ Feet: Does *** have known diabetic peripheral neuropathy .   Renal: Patient does *** have known baseline CKD. She   is *** on an ACEI/ARB at present. Check urine albumin/creatinine ratio yearly starting at time of diagnosis. If albuminuria is positive, treatment is geared toward better glucose, blood pressure control and use of ACE inhibitors or ARBs. Monitor electrolytes and creatinine once to twice yearly.   3) Lipids: Patient is *** on a statin.  4) Hypertension: *** at goal of < 140/90 mmHg.    F/U in ***    Signed electronically by: Lyndle Herrlich, MD  [facility]  [facility] [address] [place], [place] [address] Phone: [phone] FAX: [phone]   CC: Deatra James, MD [address] [place] [address] Phone: [phone]  Fax: [phone]  Return to Endocrinology clinic as below: Future Appointments  Date Time Provider Department Center  07/19/2019  8:10 AM Herby Amick, Konrad Dolores, MD [facility] None

## 2019-07-19 ENCOUNTER — Ambulatory Visit: Payer: 59 | Admitting: Internal Medicine

## 2019-07-19 ENCOUNTER — Encounter: Payer: Self-pay | Admitting: Internal Medicine

## 2019-07-19 DIAGNOSIS — Z0289 Encounter for other administrative examinations: Secondary | ICD-10-CM

## 2019-07-24 ENCOUNTER — Telehealth: Payer: Self-pay | Admitting: Internal Medicine

## 2019-07-24 NOTE — Telephone Encounter (Signed)
Patient dismissed from [facility] by Terrace Arabia, MD, effective 07/19/19. Dismissal Letter sent out by 1st class mail. [employer]

## 2020-01-10 ENCOUNTER — Other Ambulatory Visit: Payer: Self-pay | Admitting: Physician Assistant

## 2020-01-10 ENCOUNTER — Other Ambulatory Visit (HOSPITAL_COMMUNITY)
Admission: RE | Admit: 2020-01-10 | Discharge: 2020-01-10 | Disposition: A | Discharge status: Home / Self Care | Payer: 59 | Source: Ambulatory Visit | Attending: Physician Assistant | Admitting: Physician Assistant

## 2020-01-10 DIAGNOSIS — Z124 Encounter for screening for malignant neoplasm of cervix: Secondary | ICD-10-CM | POA: Diagnosis present

## 2020-01-15 LAB — CYTOLOGY - PAP
Adequacy: ABSENT
Comment: NEGATIVE
Diagnosis: UNDETERMINED — AB
High risk HPV: NEGATIVE
